# Patient Record
Sex: Female | Born: 1935 | Race: White | Hispanic: No | Marital: Married | Smoking: Former smoker
Health system: Southern US, Community
[De-identification: ages and names within clinical notes are randomized; demographics above are authoritative.]

## PROBLEM LIST (undated history)

## (undated) DIAGNOSIS — I2510 Atherosclerotic heart disease of native coronary artery without angina pectoris: Secondary | ICD-10-CM

## (undated) DIAGNOSIS — I7101 Dissection of thoracic aorta: Secondary | ICD-10-CM

## (undated) DIAGNOSIS — E785 Hyperlipidemia, unspecified: Secondary | ICD-10-CM

## (undated) DIAGNOSIS — E039 Hypothyroidism, unspecified: Secondary | ICD-10-CM

## (undated) DIAGNOSIS — R0609 Other forms of dyspnea: Secondary | ICD-10-CM

## (undated) DIAGNOSIS — I10 Essential (primary) hypertension: Secondary | ICD-10-CM

## (undated) DIAGNOSIS — N289 Disorder of kidney and ureter, unspecified: Secondary | ICD-10-CM

## (undated) DIAGNOSIS — I314 Cardiac tamponade: Secondary | ICD-10-CM

## (undated) DIAGNOSIS — I471 Supraventricular tachycardia, unspecified: Secondary | ICD-10-CM

## (undated) DIAGNOSIS — R0600 Dyspnea, unspecified: Secondary | ICD-10-CM

## (undated) DIAGNOSIS — C3490 Malignant neoplasm of unspecified part of unspecified bronchus or lung: Secondary | ICD-10-CM

## (undated) DIAGNOSIS — I714 Abdominal aortic aneurysm, without rupture, unspecified: Secondary | ICD-10-CM

## (undated) DIAGNOSIS — K2270 Barrett's esophagus without dysplasia: Secondary | ICD-10-CM

## (undated) DIAGNOSIS — C3411 Malignant neoplasm of upper lobe, right bronchus or lung: Secondary | ICD-10-CM

## (undated) DIAGNOSIS — K219 Gastro-esophageal reflux disease without esophagitis: Secondary | ICD-10-CM

## (undated) DIAGNOSIS — Z87442 Personal history of urinary calculi: Secondary | ICD-10-CM

## (undated) HISTORY — DX: Dyspnea, unspecified: R06.00

## (undated) HISTORY — DX: Barrett's esophagus without dysplasia: K22.70

## (undated) HISTORY — DX: Hyperlipidemia, unspecified: E78.5

## (undated) HISTORY — DX: Dissection of thoracic aorta: I71.01

## (undated) HISTORY — DX: Supraventricular tachycardia: I47.1

## (undated) HISTORY — DX: Supraventricular tachycardia, unspecified: I47.10

## (undated) HISTORY — PX: OTHER SURGICAL HISTORY: SHX169

## (undated) HISTORY — DX: Essential (primary) hypertension: I10

## (undated) HISTORY — DX: Other forms of dyspnea: R06.09

## (undated) HISTORY — DX: Atherosclerotic heart disease of native coronary artery without angina pectoris: I25.10

## (undated) HISTORY — DX: Abdominal aortic aneurysm, without rupture, unspecified: I71.40

## (undated) HISTORY — DX: Gastro-esophageal reflux disease without esophagitis: K21.9

## (undated) HISTORY — DX: Cardiac tamponade: I31.4

## (undated) HISTORY — DX: Malignant neoplasm of upper lobe, right bronchus or lung: C34.11

## (undated) HISTORY — DX: Abdominal aortic aneurysm, without rupture: I71.4

---

## 1993-06-29 DIAGNOSIS — I7101 Dissection of thoracic aorta: Secondary | ICD-10-CM

## 1993-06-29 DIAGNOSIS — I71012 Dissection of descending thoracic aorta: Secondary | ICD-10-CM

## 1993-06-29 HISTORY — DX: Dissection of descending thoracic aorta: I71.012

## 1993-06-29 HISTORY — DX: Dissection of thoracic aorta: I71.01

## 1998-06-29 DIAGNOSIS — I2510 Atherosclerotic heart disease of native coronary artery without angina pectoris: Secondary | ICD-10-CM

## 1998-06-29 HISTORY — DX: Atherosclerotic heart disease of native coronary artery without angina pectoris: I25.10

## 2000-11-23 ENCOUNTER — Ambulatory Visit (HOSPITAL_COMMUNITY): Admission: RE | Admit: 2000-11-23 | Discharge: 2000-11-24 | Payer: Self-pay | Admitting: Cardiology

## 2000-11-23 ENCOUNTER — Encounter: Payer: Self-pay | Admitting: Cardiology

## 2000-12-09 ENCOUNTER — Encounter: Payer: Self-pay | Admitting: Internal Medicine

## 2000-12-09 ENCOUNTER — Ambulatory Visit (HOSPITAL_COMMUNITY): Admission: RE | Admit: 2000-12-09 | Discharge: 2000-12-09 | Payer: Self-pay | Admitting: Internal Medicine

## 2001-01-10 ENCOUNTER — Ambulatory Visit (HOSPITAL_COMMUNITY): Admission: RE | Admit: 2001-01-10 | Discharge: 2001-01-10 | Payer: Self-pay | Admitting: Internal Medicine

## 2001-01-10 HISTORY — PX: COLONOSCOPY: SHX174

## 2001-08-17 ENCOUNTER — Ambulatory Visit (HOSPITAL_COMMUNITY): Admission: RE | Admit: 2001-08-17 | Discharge: 2001-08-17 | Payer: Self-pay | Admitting: Cardiology

## 2001-12-12 ENCOUNTER — Ambulatory Visit (HOSPITAL_COMMUNITY): Admission: RE | Admit: 2001-12-12 | Discharge: 2001-12-12 | Payer: Self-pay | Admitting: Internal Medicine

## 2001-12-12 ENCOUNTER — Encounter: Payer: Self-pay | Admitting: Internal Medicine

## 2003-02-07 ENCOUNTER — Ambulatory Visit (HOSPITAL_COMMUNITY): Admission: RE | Admit: 2003-02-07 | Discharge: 2003-02-07 | Payer: Self-pay | Admitting: Internal Medicine

## 2005-02-26 ENCOUNTER — Ambulatory Visit: Payer: Self-pay | Admitting: Cardiology

## 2005-03-18 ENCOUNTER — Ambulatory Visit (HOSPITAL_COMMUNITY): Admission: RE | Admit: 2005-03-18 | Discharge: 2005-03-18 | Payer: Self-pay | Admitting: Family Medicine

## 2005-04-22 ENCOUNTER — Ambulatory Visit: Payer: Self-pay | Admitting: Internal Medicine

## 2005-04-23 ENCOUNTER — Ambulatory Visit (HOSPITAL_COMMUNITY): Admission: RE | Admit: 2005-04-23 | Discharge: 2005-04-23 | Payer: Self-pay | Admitting: Internal Medicine

## 2005-04-29 ENCOUNTER — Ambulatory Visit: Payer: Self-pay | Admitting: Internal Medicine

## 2005-05-05 ENCOUNTER — Encounter: Admission: RE | Admit: 2005-05-05 | Discharge: 2005-05-05 | Payer: Self-pay | Admitting: Vascular Surgery

## 2005-05-07 ENCOUNTER — Ambulatory Visit: Payer: Self-pay

## 2005-05-08 ENCOUNTER — Ambulatory Visit: Payer: Self-pay | Admitting: Cardiology

## 2005-05-14 ENCOUNTER — Ambulatory Visit (HOSPITAL_COMMUNITY): Admission: RE | Admit: 2005-05-14 | Discharge: 2005-05-14 | Payer: Self-pay | Admitting: Vascular Surgery

## 2005-06-12 ENCOUNTER — Inpatient Hospital Stay (HOSPITAL_COMMUNITY): Admission: RE | Admit: 2005-06-12 | Discharge: 2005-06-20 | Payer: Self-pay | Admitting: Vascular Surgery

## 2005-06-12 ENCOUNTER — Encounter (INDEPENDENT_AMBULATORY_CARE_PROVIDER_SITE_OTHER): Payer: Self-pay | Admitting: *Deleted

## 2005-06-12 HISTORY — PX: ABDOMINAL AORTIC ANEURYSM REPAIR: SUR1152

## 2005-06-17 ENCOUNTER — Ambulatory Visit: Payer: Self-pay | Admitting: Cardiovascular Disease

## 2005-11-19 ENCOUNTER — Ambulatory Visit: Payer: Self-pay | Admitting: Internal Medicine

## 2005-12-03 ENCOUNTER — Ambulatory Visit: Payer: Self-pay | Admitting: Internal Medicine

## 2005-12-03 ENCOUNTER — Encounter (INDEPENDENT_AMBULATORY_CARE_PROVIDER_SITE_OTHER): Payer: Self-pay | Admitting: *Deleted

## 2005-12-03 ENCOUNTER — Ambulatory Visit (HOSPITAL_COMMUNITY): Admission: RE | Admit: 2005-12-03 | Discharge: 2005-12-03 | Payer: Self-pay | Admitting: Internal Medicine

## 2005-12-03 HISTORY — PX: OTHER SURGICAL HISTORY: SHX169

## 2006-02-26 ENCOUNTER — Ambulatory Visit: Payer: Self-pay | Admitting: Cardiology

## 2006-03-08 ENCOUNTER — Ambulatory Visit (HOSPITAL_COMMUNITY): Admission: RE | Admit: 2006-03-08 | Discharge: 2006-03-08 | Payer: Self-pay | Admitting: Family Medicine

## 2007-03-02 ENCOUNTER — Ambulatory Visit: Payer: Self-pay | Admitting: Cardiology

## 2007-03-23 ENCOUNTER — Ambulatory Visit: Payer: Self-pay

## 2007-03-26 ENCOUNTER — Emergency Department (HOSPITAL_COMMUNITY): Admission: EM | Admit: 2007-03-26 | Discharge: 2007-03-26 | Payer: Self-pay | Admitting: Emergency Medicine

## 2007-04-13 ENCOUNTER — Ambulatory Visit: Payer: Self-pay | Admitting: Internal Medicine

## 2007-05-09 ENCOUNTER — Ambulatory Visit: Payer: Self-pay | Admitting: Internal Medicine

## 2008-03-08 ENCOUNTER — Ambulatory Visit: Payer: Self-pay | Admitting: Cardiology

## 2008-12-06 ENCOUNTER — Encounter: Payer: Self-pay | Admitting: Cardiology

## 2009-01-25 ENCOUNTER — Encounter (INDEPENDENT_AMBULATORY_CARE_PROVIDER_SITE_OTHER): Payer: Self-pay | Admitting: *Deleted

## 2009-02-28 DIAGNOSIS — I10 Essential (primary) hypertension: Secondary | ICD-10-CM | POA: Insufficient documentation

## 2009-02-28 DIAGNOSIS — Z8679 Personal history of other diseases of the circulatory system: Secondary | ICD-10-CM | POA: Insufficient documentation

## 2009-02-28 DIAGNOSIS — I319 Disease of pericardium, unspecified: Secondary | ICD-10-CM | POA: Insufficient documentation

## 2009-02-28 DIAGNOSIS — E785 Hyperlipidemia, unspecified: Secondary | ICD-10-CM | POA: Insufficient documentation

## 2009-03-05 ENCOUNTER — Ambulatory Visit: Payer: Self-pay | Admitting: Cardiology

## 2009-03-05 DIAGNOSIS — I712 Thoracic aortic aneurysm, without rupture, unspecified: Secondary | ICD-10-CM | POA: Insufficient documentation

## 2009-03-05 DIAGNOSIS — R0609 Other forms of dyspnea: Secondary | ICD-10-CM

## 2009-03-05 DIAGNOSIS — R0989 Other specified symptoms and signs involving the circulatory and respiratory systems: Secondary | ICD-10-CM

## 2009-03-12 DIAGNOSIS — R197 Diarrhea, unspecified: Secondary | ICD-10-CM

## 2009-03-12 DIAGNOSIS — R109 Unspecified abdominal pain: Secondary | ICD-10-CM | POA: Insufficient documentation

## 2009-03-12 DIAGNOSIS — R1110 Vomiting, unspecified: Secondary | ICD-10-CM

## 2009-03-20 ENCOUNTER — Ambulatory Visit: Payer: Self-pay | Admitting: Internal Medicine

## 2009-03-20 DIAGNOSIS — K219 Gastro-esophageal reflux disease without esophagitis: Secondary | ICD-10-CM | POA: Insufficient documentation

## 2009-03-20 DIAGNOSIS — K2270 Barrett's esophagus without dysplasia: Secondary | ICD-10-CM

## 2009-03-20 DIAGNOSIS — R198 Other specified symptoms and signs involving the digestive system and abdomen: Secondary | ICD-10-CM

## 2009-03-21 ENCOUNTER — Telehealth (INDEPENDENT_AMBULATORY_CARE_PROVIDER_SITE_OTHER): Payer: Self-pay | Admitting: *Deleted

## 2009-03-21 ENCOUNTER — Encounter: Payer: Self-pay | Admitting: Internal Medicine

## 2009-03-25 ENCOUNTER — Encounter: Payer: Self-pay | Admitting: Cardiology

## 2009-03-25 ENCOUNTER — Ambulatory Visit: Payer: Self-pay

## 2009-03-25 ENCOUNTER — Ambulatory Visit: Payer: Self-pay | Admitting: Cardiology

## 2009-04-10 ENCOUNTER — Ambulatory Visit: Payer: Self-pay | Admitting: Internal Medicine

## 2009-04-10 ENCOUNTER — Encounter: Payer: Self-pay | Admitting: Internal Medicine

## 2009-04-10 ENCOUNTER — Ambulatory Visit (HOSPITAL_COMMUNITY): Admission: RE | Admit: 2009-04-10 | Discharge: 2009-04-10 | Payer: Self-pay | Admitting: Internal Medicine

## 2009-04-10 HISTORY — PX: ESOPHAGOGASTRODUODENOSCOPY: SHX1529

## 2009-04-14 ENCOUNTER — Encounter: Payer: Self-pay | Admitting: Internal Medicine

## 2009-04-16 ENCOUNTER — Ambulatory Visit (HOSPITAL_COMMUNITY): Admission: RE | Admit: 2009-04-16 | Discharge: 2009-04-16 | Payer: Self-pay | Admitting: Internal Medicine

## 2009-04-30 ENCOUNTER — Telehealth (INDEPENDENT_AMBULATORY_CARE_PROVIDER_SITE_OTHER): Payer: Self-pay

## 2010-03-04 DIAGNOSIS — I2510 Atherosclerotic heart disease of native coronary artery without angina pectoris: Secondary | ICD-10-CM | POA: Insufficient documentation

## 2010-03-07 ENCOUNTER — Ambulatory Visit: Payer: Self-pay | Admitting: Cardiology

## 2010-03-07 DIAGNOSIS — R5383 Other fatigue: Secondary | ICD-10-CM

## 2010-03-07 DIAGNOSIS — R5381 Other malaise: Secondary | ICD-10-CM

## 2010-04-01 ENCOUNTER — Ambulatory Visit: Payer: Self-pay | Admitting: Cardiology

## 2010-04-02 LAB — CONVERTED CEMR LAB: TSH: 0.97 microintl units/mL (ref 0.35–5.50)

## 2010-06-03 ENCOUNTER — Ambulatory Visit: Payer: Self-pay | Admitting: Internal Medicine

## 2010-06-03 DIAGNOSIS — R1011 Right upper quadrant pain: Secondary | ICD-10-CM

## 2010-06-09 ENCOUNTER — Ambulatory Visit (HOSPITAL_COMMUNITY)
Admission: RE | Admit: 2010-06-09 | Discharge: 2010-06-09 | Payer: Self-pay | Source: Home / Self Care | Attending: Internal Medicine | Admitting: Internal Medicine

## 2010-06-10 ENCOUNTER — Encounter: Payer: Self-pay | Admitting: Internal Medicine

## 2010-06-11 ENCOUNTER — Encounter (HOSPITAL_COMMUNITY)
Admission: RE | Admit: 2010-06-11 | Discharge: 2010-07-11 | Payer: Self-pay | Source: Home / Self Care | Attending: Internal Medicine | Admitting: Internal Medicine

## 2010-06-17 ENCOUNTER — Encounter: Payer: Self-pay | Admitting: Internal Medicine

## 2010-07-03 LAB — HEPATIC FUNCTION PANEL
ALT: 22 U/L (ref 0–35)
AST: 27 U/L (ref 0–37)
Albumin: 3.9 g/dL (ref 3.5–5.2)
Alkaline Phosphatase: 66 U/L (ref 39–117)
Bilirubin, Direct: 0.1 mg/dL (ref 0.0–0.3)
Indirect Bilirubin: 0.5 mg/dL (ref 0.3–0.9)
Total Bilirubin: 0.6 mg/dL (ref 0.3–1.2)
Total Protein: 6.4 g/dL (ref 6.0–8.3)

## 2010-07-03 LAB — CBC
HCT: 39.3 % (ref 36.0–46.0)
Hemoglobin: 13.7 g/dL (ref 12.0–15.0)
MCH: 33.9 pg (ref 26.0–34.0)
MCHC: 34.9 g/dL (ref 30.0–36.0)
MCV: 97.3 fL (ref 78.0–100.0)
Platelets: 152 10*3/uL (ref 150–400)
RBC: 4.04 MIL/uL (ref 3.87–5.11)
RDW: 13 % (ref 11.5–15.5)
WBC: 7.1 10*3/uL (ref 4.0–10.5)

## 2010-07-03 LAB — BASIC METABOLIC PANEL
BUN: 20 mg/dL (ref 6–23)
CO2: 26 mEq/L (ref 19–32)
Calcium: 9.2 mg/dL (ref 8.4–10.5)
Chloride: 104 mEq/L (ref 96–112)
Creatinine, Ser: 1.16 mg/dL (ref 0.4–1.2)
GFR calc Af Amer: 55 mL/min — ABNORMAL LOW (ref >60)
GFR calc non Af Amer: 45 mL/min — ABNORMAL LOW (ref >60)
Glucose, Bld: 67 mg/dL — ABNORMAL LOW (ref 70–99)
Potassium: 4.1 mEq/L (ref 3.5–5.1)
Sodium: 139 mEq/L (ref 135–145)

## 2010-07-04 ENCOUNTER — Encounter: Payer: Self-pay | Admitting: Cardiology

## 2010-07-07 ENCOUNTER — Encounter (INDEPENDENT_AMBULATORY_CARE_PROVIDER_SITE_OTHER): Payer: Self-pay | Admitting: *Deleted

## 2010-07-09 ENCOUNTER — Ambulatory Visit (HOSPITAL_COMMUNITY)
Admission: RE | Admit: 2010-07-09 | Discharge: 2010-07-09 | Payer: Self-pay | Source: Home / Self Care | Attending: General Surgery | Admitting: General Surgery

## 2010-07-09 HISTORY — PX: CHOLECYSTECTOMY: SHX55

## 2010-07-24 LAB — SURGICAL PCR SCREEN
MRSA, PCR: NEGATIVE
Staphylococcus aureus: NEGATIVE

## 2010-07-27 LAB — CONVERTED CEMR LAB
ALT: 15 units/L (ref 0–35)
ALT: 20 units/L (ref 0–35)
AST: 23 units/L (ref 0–37)
AST: 26 units/L (ref 0–37)
Albumin: 4 g/dL (ref 3.5–5.2)
Albumin: 4.5 g/dL (ref 3.5–5.2)
Alkaline Phosphatase: 55 units/L (ref 39–117)
Alkaline Phosphatase: 62 units/L (ref 39–117)
BUN: 18 mg/dL (ref 6–23)
BUN: 20 mg/dL (ref 6–23)
Bilirubin, Direct: 0.1 mg/dL (ref 0.0–0.3)
Bilirubin, Direct: 0.1 mg/dL (ref 0.0–0.3)
CO2: 26 meq/L (ref 19–32)
CO2: 29 meq/L (ref 19–32)
Calcium: 9.4 mg/dL (ref 8.4–10.5)
Calcium: 9.7 mg/dL (ref 8.4–10.5)
Chloride: 101 meq/L (ref 96–112)
Chloride: 109 meq/L (ref 96–112)
Cholesterol: 127 mg/dL (ref 0–200)
Creatinine, Ser: 1.02 mg/dL (ref 0.40–1.20)
Creatinine, Ser: 1.2 mg/dL (ref 0.4–1.2)
GFR calc non Af Amer: 46.21 mL/min (ref >60)
Glucose, Bld: 81 mg/dL (ref 70–99)
Glucose, Bld: 92 mg/dL (ref 70–99)
HDL: 31.9 mg/dL — ABNORMAL LOW (ref >39.00)
Indirect Bilirubin: 0.4 mg/dL (ref 0.0–0.9)
LDL Cholesterol: 67 mg/dL (ref 0–99)
Potassium: 4.2 meq/L (ref 3.5–5.1)
Potassium: 5 meq/L (ref 3.5–5.3)
Sodium: 139 meq/L (ref 135–145)
Sodium: 142 meq/L (ref 135–145)
TSH: 5.877 microintl units/mL — ABNORMAL HIGH (ref 0.350–4.500)
Total Bilirubin: 0.5 mg/dL (ref 0.3–1.2)
Total Bilirubin: 1.1 mg/dL (ref 0.3–1.2)
Total CHOL/HDL Ratio: 4
Total Protein: 6.8 g/dL (ref 6.0–8.3)
Total Protein: 7.1 g/dL (ref 6.0–8.3)
Triglycerides: 139 mg/dL (ref 0.0–149.0)
VLDL: 27.8 mg/dL (ref 0.0–40.0)

## 2010-07-31 NOTE — Letter (Signed)
Summary: surgical clearence request  surgical clearence request   Imported By: Faythe Ghee 07/04/2010 10:04:37  _____________________________________________________________________  External Attachment:    Type:   Image     Comment:   External Document  Appended Document: surgical clearence request she is cleared at low operative risk.

## 2010-07-31 NOTE — Miscellaneous (Signed)
Summary: Orders Update  Clinical Lists Changes  Orders: Added new Test order of T-Basic Metabolic Panel [phone]) - Signed Added new Test order of T-Hepatic Function [phone]) - Signed

## 2010-07-31 NOTE — Letter (Signed)
Summary: ABD U/S ORDER  ABD U/S ORDER   Imported By: Ave Filter 06/03/2010 12:18:28  _____________________________________________________________________  External Attachment:    Type:   Image     Comment:   External Document

## 2010-07-31 NOTE — Assessment & Plan Note (Signed)
Summary: yearly/sl      Allergies Added: NKDA  Visit Type:  1 yr f/u Primary Provider:  McGough  CC:  no cardiac complaints today.  History of Present Illness: [name] comes in today for followup of her coronary disease, history of a dissected ascending aorta complicated pericardial tampanade, hypertension, hyperlipidemia, and mild aortic insufficiency.  Her last visit, we performed an echocardiogram showed normal left ventricular function EF 55-60%. She had mild aortic insufficiency. She also had a stress Myoview which showed an EF of 79% with no ischemia. Abdominal aortic ultrasound showed a normal aorta with no evidence of aneurysm.  She's been having no chest pain or anginal symptoms. Her biggest complaint is fatigue. There she denies any increase in her dyspnea on exertion, PND, orthopnea, peripheral edema or palpitations.  Clinical Reports Reviewed:  Nuclear Study:  03/25/2009:  Excerise capacity: Adenosine study with no exercise  Blood Pressure response: Normal blood pressure response  Clinical symptoms: Chest heavy  ECG impression: No significant ST segment change suggestive of ischemia  Overall impression: Normal stress nuclear study.  [name] Rough, MD, [facility]   03/23/2007:  Excerise capacity:  Adenosine study with no exercise  Blood Pressure response: Normal blood pressure response  Clinical symptoms: Chest pressure  ECG impression: No significant ST segment change suggestive of ischemia  Overall impression: Normal stress nuclear study. The LV cavity is small.  [name] Rough, MD   05/07/2005:  Excerise capacity: This was an adenosine study with no exercise  Blood Pressure response: Normal blood pressure response  Clinical symptoms: No chets pain or dyspnea  ECG impression: No diagnosotic ST ot T wave changes to suggest ischemia  Overall impression: Low risk stress nuclear study.  Vida Roller, MD   Current Medications (verified): 1)   Quinapril Hcl 20 Mg Tabs (Quinapril Hcl) .Marland Kitchen.. 1 Tab Once Daily 2)  Aspirin 81 Mg Tbec (Aspirin) .... Take One Tablet By Mouth Daily 3)  Zetia 10 Mg Tabs (Ezetimibe) .Marland Kitchen.. 1 Tab Once Daily 4)  Lipitor 10 Mg Tabs (Atorvastatin Calcium) .Marland Kitchen.. 1 Tab Once Daily 5)  Metoprolol Tartrate 100 Mg Tabs (Metoprolol Tartrate) .Marland Kitchen.. 1 Tab Two Times A Day 6)  Omeprazole 20 Mg Tbec (Omeprazole) .Marland Kitchen.. 1 Tab Once Daily 7)  Tylenol 325 Mg Tabs (Acetaminophen) .... As Needed 8)  Claritin 10 Mg Tabs (Loratadine) .... As Needed  Allergies (verified): No Known Drug Allergies  Past History:  Past Medical History: Last updated: 03/04/2010 CARDIAC TAMPONADE (ICD-423.9) SUPRAVENTRICULAR TACHYCARDIA, HX OF (ICD-V12.59) CAD, NATIVE VESSEL (ICD-414.01) HYPERTENSION, UNSPECIFIED (ICD-401.9) HYPERLIPIDEMIA-MIXED (ICD-272.4) ASCENDING AORTIC ANEURYSM (ICD-441.2) ABDOMINAL AORTIC ANEURYSM (ICD-441.4) DYSPNEA ON EXERTION (ICD-786.09) GERD (ICD-530.81) EARLY SATIETY (ICD-789.9) BARRETTS ESOPHAGUS (ICD-530.85) Hx of LOOSE STOOLS (ICD-787.91) Hx of ABDOMINAL PAIN (ICD-789.00) Hx of VOMITING (ICD-787.03) Gastroesophageal reflux disease with Barrett's esophagus, last endoscopy in June 2007 showed short segment Barrett's in the distal tubular esophagus, biopsies showed no dysplasia, she had a small hiatal hernia, area of the erosion with possible focal ulceration with overlying eschar and exudate with benign biopsies. H. pylori serology is positive and she was treated with Prevpac. Last colonoscopy in 2002 was normal. She is due for screening colonoscopy in [address].   Past Surgical History: Last updated: 03/20/2009 EGD with biopsy.Jonathon Bellows, M.D...12/03/2005 Resection and grafting of infrarenal abdominal aortic aneurysm with insertion of an aortobiexternal iliac graft using a 14 x 7 mm Hemashield graft. SURGEON:  Quita Skye. Hart Rochester, M.D.06/12/2005 Abdominal aortogram (biplane abdominal aortogram with bilateral lower  extremity runoff the right  common femoral approach). SURGEON:  Dr. Hart Rochester..05/14/2005 Esophagogastroduodenoscopy with biopsy.Jonathon Bellows, M.D. Marland Kitchen. 02/07/2003 History of dissecting aneurysm in the chest in 1995 requiring surgery. She required pericardial  window for cardiac tamponade. 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 Married, 4 sons Tobacco Use - Former. quit 1995.Marland Kitchen1ppd x 53yrs ago  Risk Factors: Smoking Status: quit (02/28/2009)  Review of Systems       negative other than history of present illness  Vital Signs:  Patient profile:   77 year old female Height:      63 inches Weight:      123.8 pounds BMI:     22.01 Pulse rate:   66 / minute Pulse rhythm:   irregular BP sitting:   124 / 70  (left arm) Cuff size:   large  Vitals Entered By: Danielle Rankin, CMA (March 07, 2010 4:30 PM)  Physical Exam  General:  no acute distress, elderly Head:  normocephalic and atraumatic Eyes:  PERRLA/EOM intact; conjunctiva and lids normal. Neck:  Neck supple, no JVD. No masses, thyromegaly or abnormal cervical nodes. Lungs:  Clear bilaterally to auscultation and percussion. Heart:  PMI nondisplaced, regular rate and rhythm, normal S1-S2, no aortic insufficiency heard. Carotid Dopplers equal without bruits Msk:  decreased ROM.   Pulses:  pulses normal in all 4 extremities Extremities:  No clubbing or cyanosis. Neurologic:  Alert and oriented x 3. Skin:  pale and dry Psych:  Normal affect.   Problems:  Medical Problems Added: 1)  Dx of Fatigue / Malaise  (ICD-780.79)  EKG  Procedure date:  03/07/2010  Findings:      normal sinus rhythm, normal  EKG  Impression & Recommendations:  Problem # 1:  CAD, NATIVE VESSEL (ICD-414.01)  Her updated medication list for this problem includes:    Quinapril Hcl 20 Mg Tabs (Quinapril hcl) .Marland Kitchen... 1 tab once daily    Aspirin 81 Mg Tbec (Aspirin) .Marland Kitchen... Take one tablet by mouth daily    Metoprolol Tartrate 100 Mg Tabs (Metoprolol tartrate) .Marland Kitchen... 1 tab two times a day  Orders: EKG w/ Interpretation (93000) T-TSH (09811-91478)  Problem # 2:  HYPERTENSION, UNSPECIFIED (ICD-401.9)  Her updated medication list for this problem includes:    Quinapril Hcl 20 Mg Tabs (Quinapril hcl) .Marland Kitchen... 1 tab once daily    Aspirin 81 Mg Tbec (Aspirin) .Marland Kitchen... Take one tablet by mouth daily    Metoprolol Tartrate 100 Mg Tabs (Metoprolol tartrate) .Marland Kitchen... 1 tab two times a day  Problem # 3:  ASCENDING AORTIC ANEURYSM (ICD-441.2) Assessment: Unchanged  Problem # 4:  HYPERLIPIDEMIA-MIXED (ICD-272.4)  Her updated medication list for this problem includes:    Zetia 10 Mg Tabs (Ezetimibe) .Marland Kitchen... 1 tab once daily    Lipitor 10 Mg Tabs (Atorvastatin calcium) .Marland Kitchen... 1 tab once daily  Problem # 5:  FATIGUE / MALAISE (ICD-780.79) Assessment: New I am going to check a TSH and CBC. I suspect this is multifactorial.  Problem # 6:  DYSPNEA ON EXERTION (ICD-786.09) Assessment: Unchanged  Her updated medication list for this problem includes:    Quinapril Hcl 20 Mg Tabs (Quinapril hcl) .Marland Kitchen... 1 tab once daily    Aspirin 81 Mg Tbec (Aspirin) .Marland Kitchen... Take one tablet by  mouth daily    Metoprolol Tartrate 100 Mg Tabs (Metoprolol tartrate) .Marland Kitchen... 1 tab two times a day  Patient Instructions: 1)  Your physician recommends that you schedule a follow-up appointment in: 1 year with Dr. Daleen Squibb 2)  Your physician recommends that you continue on your current medications as directed. Please refer to the Current Medication list given to you today.

## 2010-07-31 NOTE — Assessment & Plan Note (Signed)
Summary: FU OV OMEPRAZOLE REFILLS/SS   Visit Type:  Follow-up Visit Primary Care [name]:  [name]  Chief Complaint:  F/U for Omeprazole refills.  History of Present Illness: [name] is here for f/u of GERD/Barrett's. She also has h/o CBD dilation and ?stone in 2010 on abd u/s. She never followed through on MRCP.   She has some intermittent ruq pain into back predominantly with movement. She recalls having chest tube placed years ago with her aneurysm repair.  Never had MRCP last year when abd u/s showed possible cbd stone. Rare pp abd pain. GERD doing okay, rare heartburn. Sometimes nocturnal reflux with late evening meals. Takes omeprazole everyday. No dysphagia. No weight loss. BM regular with fiber diet. No melena, brbpr. Some daily gas/nausea.   Current Medications (verified): 1)  Quinapril Hcl 20 Mg Tabs (Quinapril Hcl) .Marland Kitchen.. 1 Tab Once Daily 2)  Aspirin 81 Mg Tbec (Aspirin) .... Take One Tablet By Mouth Daily 3)  Zetia 10 Mg Tabs (Ezetimibe) .Marland Kitchen.. 1 Tab Once Daily 4)  Lipitor 10 Mg Tabs (Atorvastatin Calcium) .Marland Kitchen.. 1 Tab Once Daily 5)  Metoprolol Tartrate 100 Mg Tabs (Metoprolol Tartrate) .Marland Kitchen.. 1 Tab Two Times A Day 6)  Omeprazole 20 Mg Tbec (Omeprazole) .Marland Kitchen.. 1 Tab Once Daily 7)  Tylenol 325 Mg Tabs (Acetaminophen) .... As Needed 8)  Levothyroxine Sodium 50 Mcg Tabs (Levothyroxine Sodium) .... Take 1 Tablet Daily  Allergies (verified): No Known Drug Allergies  Review of Systems      See HPI  Vital Signs:  Patient profile:   77 year old female Height:      63 inches Weight:      128 pounds BMI:     22.76 Temp:     98.4 degrees F oral Pulse rate:   68 / minute BP sitting:   132 / 68  (left arm) Cuff size:   regular  Vitals Entered By: Cloria Spring LPN (June 03, 2010 9:40 AM)  Physical Exam  General:  Well developed, well nourished, no acute distress. Head:  Normocephalic and atraumatic. Eyes:  sclera nonicteric Mouth:  op moist Lungs:  Clear throughout to  auscultation. Heart:  Regular rate and rhythm; no murmurs, rubs,  or bruits. Abdomen:  Bowel sounds normal.  Abdomen is soft, nontender, nondistended.  No rebound or guarding.  No hepatosplenomegaly, masses or hernias.  No abdominal bruits.  Extremities:  No clubbing, cyanosis, edema or deformities noted. Neurologic:  Alert and  oriented x4;  grossly normal neurologically. Skin:  Intact without significant lesions or rashes. Psych:  Alert and cooperative. Normal mood and affect.  Impression & Recommendations:  Problem # 1:  GERD (ICD-530.81)  Chronic GERD and Barrett's. Doing well. Next EGD due 03/2012. Continue omeprazole. OV 10/12.  Orders: Est. Patient Level II (16109)  Problem # 2:  RUQ PAIN (ICD-789.01)  Intermittent RUQ pain, not really pp. Mostly with movement. She does have h/o abnormal u/s last year with ?CBD stone. Patient failed to have MRCP done. Recent LFTs normal. Doubt she has retained CBD stone but check abd u/s again and if suspicious then will pursue MRCP.   Orders: Est. Patient Level II (60454)

## 2010-07-31 NOTE — Letter (Signed)
Summary: HIDA SCAN ORDER  HIDA SCAN ORDER   Imported By: Ave Filter 06/10/2010 10:11:48  _____________________________________________________________________  External Attachment:    Type:   Image     Comment:   External Document

## 2010-07-31 NOTE — Letter (Signed)
Summary: DR Lovell Sheehan REFERRAL  DR Lovell Sheehan REFERRAL   Imported By: Ave Filter 06/17/2010 16:03:26  _____________________________________________________________________  External Attachment:    Type:   Image     Comment:   External Document

## 2010-07-31 NOTE — Miscellaneous (Signed)
Summary: HISTORY & PHYSICAL  Clinical Lists Changes  [name], [name]                ACCOUNT NO.:  [id number]      MEDICAL RECORD NO.:  [id number]         PATIENT TYPE:  PAMB      LOCATION:  DAY                           FACILITY:  [facility]      PHYSICIAN:  Dalia Heading, M.D.  DATE OF BIRTH:  Sep 08, 1928      DATE OF ADMISSION:   DATE OF DISCHARGE:  LH                                 HISTORY          CHIEF COMPLAINT:  Chronic cholecystitis.      HISTORY OF PRESENT ILLNESS:  The patient is a 77 year old white female   who is referred for evaluation and treatment of biliary colic secondary   to chronic cholecystitis.  She has been having intermittent right upper   quadrant abdominal pain with radiation to the flank, nausea, and   indigestion for the past few years.  It seems to be getting worse.  It   is made worse with fatty foods.  No fever, chills, or jaundice have been   noted.      PAST MEDICAL HISTORY:  Chronic back issues, hypothyroidism,   hypertension, high cholesterol levels, extrinsic allergies.      PAST SURGICAL HISTORY:  Dissecting aortic repair in the past, back   surgery, pericardial window.      CURRENT MEDICATIONS:  Baby aspirin which she is holding, Tylenol as   needed for pain, a cholesterol pill, quinapril 20 mg p.o. daily,   metoprolol 100 mg p.o. b.i.d., levothyroxine 50 mcg p.o. daily, Zetia 10   mg p.o. daily.      ALLERGIES:  No known drug allergies.      REVIEW OF SYSTEMS:  The patient denies drinking or smoking.  She denies   any other cardiopulmonary difficulties or bleeding disorders.      FAMILY MEDICAL HISTORY:  Noncontributory.      PHYSICAL EXAMINATION:  GENERAL:  The patient is a well-developed, well-   nourished white female in no acute distress.   HEENT:  No scleral icterus.   LUNGS:  Clear to auscultation with equal breath sounds bilaterally.   HEART:  Regular rate and rhythm without S3, S4, or murmurs.   ABDOMEN:  Soft and  nondistended.  She is tender in the right upper   quadrant to palpation.  No hepatosplenomegaly, masses, or hernias are   identified.      Ultrasound of the gallbladder reveals sludge within a normal common bile   duct.  HIDA scan reveals a low gallbladder ejection fraction with   reproducible symptoms.      IMPRESSION:  Chronic cholecystitis.      PLAN:  The patient is scheduled for laparoscopic cholecystectomy on   July 09, 2010.  The risks and benefits of the procedure including   bleeding, infection, hepatobiliary injury, and the possibility of an   open procedure were fully explained to the patient, gave informed   consent.               Mark A.  Lovell Sheehan, M.D.               MAJ/MEDQ  D:  07/01/2010  T:  07/02/2010  Job:  [id number]      cc:   R. Roetta Sessions, M.D.   [address]   [place]   [place] [address]      Kirk Ruths, M.D.   Fax: [phone]      Electronically Signed by Franky Macho M.D. on 07/03/2010 11:18:06 AM

## 2010-10-08 ENCOUNTER — Other Ambulatory Visit: Payer: Self-pay | Admitting: Cardiology

## 2010-10-08 NOTE — Telephone Encounter (Signed)
Please send to primary MD

## 2010-10-10 ENCOUNTER — Other Ambulatory Visit: Payer: Self-pay | Admitting: *Deleted

## 2010-10-10 MED ORDER — LEVOTHYROXINE SODIUM 50 MCG PO TABS: 50.0000 ug | ORAL_TABLET | Freq: Every day | ORAL | Status: DC

## 2010-10-10 NOTE — Telephone Encounter (Signed)
Pt needs levothyroxine to be called to [facility]. Pt states dr wall was the one who gave it to her.

## 2010-10-14 ENCOUNTER — Other Ambulatory Visit: Payer: Self-pay | Admitting: Cardiology

## 2010-11-11 NOTE — Assessment & Plan Note (Signed)
NAMELOVELL, ROE                 CHART#:  [id number]   DATE:                                   DOB:  02-16-1929   CHIEF COMPLAINT:  Recent diverticulitis.   SUBJECTIVE:  The patient is here for a follow up.  She was asked to  follow up with our office after a recent emergency department visit.  She was seen in the emergency department on 03/26/2007.  She presented  with vomiting, abdominal pain, and loose stool.  Her white count was  13,800.  LFT's were normal.  Sed rate was 3.  She had a CT of the  abdomen and pelvis which revealed mild sigmoid diverticulitis without  abscess.  She was started on Cipro and Flagyl.  She has completed her  antibiotics.  She states she is feeling better.  She has had no further  abdominal pain.  She states her stools are formed.  She is having one  formed stool daily.  Her reflux is well controlled.  She has had no  further vomiting.  Her appetite is improving.  She feels well.  She has  some cough and congestion related to a viral upper respiratory  infection.  She has had this for about 4 days.   CURRENT MEDICATIONS:  See updated list.   ALLERGIES:  No known drug allergies.   PHYSICAL EXAMINATION:  VITAL SIGNS:  Weight:  128.5.  Temperature:  97.7.  Blood pressure:  100/70.  Pulse:  72.  GENERAL:  Pleasant, well-nourished, well-developed, elderly Caucasian  female in no acute distress.  SKIN:  Warm and dry without jaundice.  CHEST:  Lungs are clear to auscultation.  CARDIAC:  Reveals regular rate and rhythm.  ABDOMEN:  Positive bowel sounds, soft, nontender and nondistended.  No  organomegaly or masses.  No rebound tenderness or guarding.  LOWER EXTREMITIES:  No edema.   IMPRESSION:  1. Recent acute diverticulitis, resolved.  2. Chronic gastroesophageal reflux disease with history of Barrett's      esophagus.   PLAN:  1. We will discuss further with Dr. Jena Gauss with regards to timing of      her next surveillance EGD.  2. Patient's last  colonoscopy was in 2002.  We will address whether or      not she needs to have a colonoscopy based on recent CT findings      although appears to be uncomplicated      diverticulitis.  3. Further recommendations to follow.       Tana Coast, P.A.  Electronically Signed     R. Roetta Sessions, M.D.  Electronically Signed    LL/MEDQ  D:  04/13/2007  T:  04/14/2007  Job:  [id number]

## 2010-11-11 NOTE — Assessment & Plan Note (Signed)
[facility]                            CARDIOLOGY OFFICE NOTE   NAME:[name], [name] BOWERSOX                       MRN:          [id number]  DATE:03/08/2008                            DOB:          1929/01/30    [name] returns today for followup.   PROBLEMS:  1. Coronary artery disease.  She has had a previous stent at the right      coronary artery several years ago.  Her last stress Myoview was      March 23, 2007, which showed an EF of 84% with no ischemia.      She has had a small LV cavity.  2. 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  This result in tamponade with an emergency      pericardiocentesis with exploratory surgery repair by Dr. Andrey Campanile 14      years ago.  3. Hyperlipidemia.  4. Hypertension.  5. History of abdominal aortic aneurysm repair by Dr. Hart Rochester in      December 2006.  Her last abdominal ultrasound showed aorto by      external iliac artery Hemashield bypass graft to be patent with      normal caliber and flow.  She had a widely patent proximal left      renal artery by color Doppler.  She has an occluded right renal      artery which was seen at the surgery.  6. History of supraventricular tachycardia.   She has had a good year overall.  She is having no symptoms of angina or  ischemia.  She has had no chest pain, no abdominal pain.   Her current meds were Zetia 10 mg a day, Lipitor 10 mg a day, quinapril  20 mg a day, aspirin 81 mg a day, metoprolol 100 mg p.o. b.i.d.,  omeprazole 20 mg a day.   PHYSICAL EXAMINATION:  Her blood pressure is 114/68, her pulse is 63,  and she is in sinus rhythm with a normal EKG overall.  Her weight is  132, which is stable.  HEENT is normal.  Skin color is pale and dry.  Carotid upstrokes were equal bilateral without bruits, no JVD.  Thyroid  is not enlarged.  HEART reveals soft S1 and S2.  There is no diastolic  murmur.  Lungs are clear to auscultation  and percussion.  Abdominal exam  is soft with no pulsatile mass.  No bruit.  There is no obvious  organomegaly.  Extremities reveal pulses to be intact distally 1+ or 4+,  capillary reflexes are reduced, her feet are slightly cool.  No sign of  tissue loss or ulceration, no sign of DVT.  Neuro exam is intact.  Skin  has got to have few ecchymoses otherwise normal.   [name] is stable from our standpoint.  She is not to do any  objective studies.  We have made no changes in medical program.  I will  see her back again in a year.    Thomas C. Daleen Squibb, MD, [facility]  Electronically Signed  TCW/MedQ  DD: 03/08/2008  DT: 03/09/2008  Job #: [id number]   cc:   Kirk Ruths, M.D.

## 2010-11-11 NOTE — Assessment & Plan Note (Signed)
[facility]                            CARDIOLOGY OFFICE NOTE   NAME:[name], [name] BASURTO                       MRN:          [id number]  DATE:03/02/2007                            DOB:          1928-10-09    [name] returns today for management of the following issues:  1. Coronary artery disease. She is having no ischemic symptoms. She      had a stress Myoview on May 07, 2005, that showed an EF of 83%      with no ischemia.  2. Peripheral vascular disease. She is status post spontaneous aortic      dissection with pericardial tamponade, status post emergency      pericardiocentesis, exploratory surgery and repair by Dr.  Andrey Campanile      13 years ago.  3. Hyperlipidemia. This is being followed by Dr.  Regino Schultze.  4. Hypertension.  5. Status post abdominal aortic aneurysm repair by Dr.  Hart Rochester      December 2006.  6. History of supraventricular tachycardia.   She has had a good year. Her weight is stable. She is having no symptoms  of angina or ischemia.   MEDICATIONS:  1. Zetia 10 mg a day.  2. Aciphex 20 mg a day.  3. Lipitor 10 mg a day.  4. Toprol 100 mg a day.  5. Quinapril 20 mg a day.  6. Aspirin 81 mg a day.   Her blood pressure today is 115/70, pulse 67 and regular. EKG is normal.  Weight is stable at 133 pounds.  HEENT: Normocephalic, atraumatic. PERRLA. Extra-ocular movements intact.  Sclerae are clear. Her skin is pale at its baseline.  NECK: Supple. Carotid upstrokes are equal bilaterally without bruits.  There is no JVD.  Thyroid is not enlarged. Trachea is midline.  LUNGS:  Reveal decreased breath sounds throughout.  HEART: Reveals normal PMI. Normal S1, S2.  ABDOMEN: Soft with good bowel sounds. There is no midline bruit.  EXTREMITIES: Reveals no cyanosis, clubbing or edema. Pulses are 1+/4+  bilaterally symmetrical. She has varicose veins. There is no sign of  DVT.  SKIN: Is unremarkable.   ASSESSMENT/PLAN:  [name] is  doing well. She is due an adenosine  Myoview and abdominal ultrasound. We will schedule those for followup.  Assuming these are stable, I will see her back in a year.     Thomas C. Daleen Squibb, MD, [facility]  Electronically Signed    TCW/MedQ  DD: 03/02/2007  DT: 03/02/2007  Job #: [id number]   cc:   Kirk Ruths, M.D.

## 2010-11-14 NOTE — Op Note (Signed)
[name], BAHNER                ACCOUNT NO.:  [id number]   MEDICAL RECORD NO.:  [id number]          PATIENT TYPE:  AMB   LOCATION:  SDS                          FACILITY:  [facility]   PHYSICIAN:  Quita Skye. Hart Rochester, M.D.  DATE OF BIRTH:  [birth year]   DATE OF PROCEDURE:  05/14/2005  DATE OF DISCHARGE:                                 OPERATIVE REPORT   PREOP DIAGNOSIS:  Abdominal aortic aneurysm.   POSTOPERATIVE DIAGNOSIS:  Abdominal aortic aneurysm.   PROCEDURE:  Abdominal aortogram (biplane abdominal aortogram with bilateral  lower extremity runoff the right common femoral approach).   SURGEON:  Dr. Hart Rochester.   ANESTHESIA:  Local Xylocaine.   CONTRAST:  170 mL.   COMPLICATIONS:  None.   DESCRIPTION OF PROCEDURE:  The patient was taken to the [facility]  peripheral endovascular placed in supine position at which time the right  groin was prepped Betadine scrub and solution and draped in routine sterile  manner. After infiltration of 1% Xylocaine, right common femoral artery was  entered percutaneously. Guidewire passed into the suprarenal aorta under  fluoroscopic guidance. 5-French sheath and dilator were passed over the  guide wire and dilator removed. The standard graduated pigtail catheter  positioned in the suprarenal aorta. Flush abdominal aortogram was performed  injecting 20 mL contrast at 20 mL per second. This revealed the aorta to be  widely patent. There was an aneurysmal bulge at the origin of a chronically  occluded right renal artery which was about 1 cm x 13 mm in diameter. Left  renal artery was widely patent. There was an infrarenal aortic aneurysm with  a conical neck leading to this and a calcified rim. Both common internal and  external iliac arteries were widely patent although slightly tortuous on the  left side. Lateral aortogram was performed revealing widely patent superior  mesenteric artery and no acute angulation involving the neck of the  aneurysm.  Catheter was withdrawn into the terminal aorta and bilateral lower  extremity runoff performed injecting 88 mL of contrast at 8 mL per second.  This revealed both common iliac arteries as noted to be widely patent as  were the external iliac arteries and internal iliac arteries. The profunda  femoris and superficial femoral arteries bilaterally were widely patent and  normal in appearance as were the popliteal arteries. There was diffuse  tibial disease and both legs with the anterior tibial and the posterior  tibial arteries being small with best vessel bilaterally being the peroneal  artery. Following this, additional views of the iliac arteries were obtained  in the RAO and LAO projections injecting 15 mL of contrast on each occasion  revealing some tortuosity at the origin of the left common iliac artery and  some mild narrowing at the origin of the right common iliac artery and the  40% range. Having tolerated procedure well, the sheath was removed, adequate  compression applied.  No complications ensued.   FINDINGS:  1.  Infrarenal aortic aneurysm.  2.  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  3.  Occluded inferior  mesenteric artery.  4.  Widely patent iliac arteries bilaterally.  5.  Diffuse tibial disease with best runoff bilaterally being peroneal      artery with small posterior tibial anterior tibial arteries bilaterally.           ______________________________  Quita Skye Hart Rochester, M.D.     JDL/MEDQ  D:  05/14/2005  T:  05/14/2005  Job:  [id number]

## 2010-11-14 NOTE — Op Note (Signed)
[name], [name]                ACCOUNT NO.:  [id number]   MEDICAL RECORD NO.:  [id number]          PATIENT TYPE:  AMB   LOCATION:  DAY                           FACILITY:  [facility]   PHYSICIAN:  R. Roetta Sessions, M.D. DATE OF BIRTH:  12-Aug-1928   DATE OF PROCEDURE:  12/03/2005  DATE OF DISCHARGE:                                 OPERATIVE REPORT   PROCEDURE PERFORMED:  EGD with biopsy.   INDICATIONS FOR PROCEDURE:  The patient is a 77 year old lady with a history  of short-segment Barrett's esophagus.  She is here for surveillance.  This  procedure was discussed with the patient at length.  The potential risks,  benefits and alternatives have been reviewed.  Questions were answered.  She  is agreeable.  Please see documentation on medical record.   PROCEDURE:  Oxygen saturation and blood pressure were monitored.  After  conscious sedation with Versed 3 mg and IV Demerol 50 mg given in divided  doses, the patient was prepped with topical anesthesia.   FINDINGS:  Examination of tubular esophagus revealed a patulous EG junction.  There was about a  2 cm segment of salmon-colored epithelium coming up  above, what appeared to be, the EG junction.  There was no evidence of  esophagitis or neoplasia.  The EG junction was patulous, easily traversed.  Stomach:  The gastric cavity was empty and insufflated well with air .  Examination of the gastric mucosa, retroflexed view of the proximal stomach,  esophagogastric junction demonstrated a small hiatal hernia and a 1 cm area  of EE eroded friable mucosa, with overlying fixed, what appeared to be,  cream-colored eschar (please see the photos).  This would not wash or wipe  off.  This area was biopsied.  The remaining gastric mucosa appeared normal  and patent.  The pylorus was patent and easily traversed.  Examination of  the bulb and second portion revealed no abnormalities.   THERAPEUTIC/DIAGNOSTIC MANEUVERS PERFORMED:  As stated above, area  of  gastric mucosal abnormalities were biopsied.  Subsequent biopsies of the  salmon-colored epithelium and distal esophagus were also taken multiple.   The patient tolerated the procedure well and was reactive.   IMPRESSION:  Short segment of salmon-colored epithelium coming up into the  distal tubular esophagus.  Patchulous CD junction, status post biopsy.  A  small hiatal hernia.  Area of erosion with possible focal ulceration, with  overlying eschar exudate of uncertain significance.  Posterior gastric wall  biopsy and remaining gastric mucosa appeared normal.  Patent pylorus and  normal E1 and D2.   RECOMMENDATIONS:  1.  Follow up on pathology.  2.  Continue Aciphex 20 mg daily.  3.  Further recommendations to follow.      Jonathon Bellows, M.D.  Electronically Signed     RMR/MEDQ  D:  12/03/2005  T:  12/03/2005  Job:  [id number]   cc:   Kirk Ruths, M.D.  Fax: [phone]

## 2010-11-14 NOTE — Cardiovascular Report (Signed)
[place]  Patient:    [name], [name]                         MRN: [id number] Proc. Date: 11/23/00 Attending:  Everardo Beals. Juanda Chance, M.D. [facility] CC:         Yetta Numbers, M.D.  Thomas C. Wall, M.D. [facility]  Cardiopulmonary Lab   Cardiac Catheterization  PROCEDURE:  Cardiac catheterization and stent implantation.  CARDIOLOGISTEverardo Beals Juanda Chance, M.D. [facility]  CLINICAL HISTORY:  [name] is 77 years old and has no prior history of known heart disease.  She was hospitalized here more than a year ago with chest pain and was treated with Integrilin as part of the PURSUIT trial and developed pericardial tamponade which was thought to be due to a small aortic dissection.  However, her symptoms resolved, and this was not further evaluated as best I can tell.  She also has an abdominal aortic aneurysm. Recently, she has had exertional chest pain and was seen by Dr. Juanito Doom and was scheduled for evaluation with angiography.  PROCEDURE IN DETAIL:  The procedure was performed via the right femoral artery using arterial sheath and 6-French preformed coronary catheters.  A frontal arterial puncture was performed, and Omnipaque contrast was used.  An aortic root injection was performed to assess any residual aortic dissection.  A distal abdominal aortogram was performed to evaluate her previously noted abdominal aortic aneurysm.  She tolerated the procedure well.  After completion of the diagnostic study, we made the decision to proceed with intervention on the right coronary artery.  The patient had been consented and enrolled in the TAXUS IV trial.  She was given weight-adjusted heparin for an ACT greater than 200 seconds and was given double bolus Integrilin and infusion.  We used a JR4 7-French guiding catheter with side holes and a short Luge wire.  We crossed the lesion in the mid right coronary artery with the wire without difficulty.  We predilated with a 2.5  x 15 mm Quantum Ranger, performing one inflation of 8 atmospheres for 24 seconds.  We then deployed a 2.5 x 15 mm TAXUS stent as part of the TAXUS protocol, deploying this with one inflation at 15 atmospheres for 45 seconds.  Repeat diagnostic study was then performed through the guiding catheter.  The patient tolerated the procedure well and left the laboratory in satisfactory condition.  RESULTS:  The aortic pressure was 140/60 with a mean of 90.  Left ventricular pressure was 140/11.  Left main coronary artery was free of significant disease.  Left anterior descending artery gave rise to a diagonal branch and two sets of perforators and a third diagonal branch.  The LAD was irregular.  There was no significant obstruction.  The circumflex artery gave rise to a marginal branch and two posterolateral branches.  There was 40% narrowing in the marginal branch.  The right coronary artery was a small to moderate size vessel that gave rise to a right ventricular branch, a posterior descending branch, and a small posterolateral branch.  There was 90% stenosis in the mid vessel.  There was 40% narrowing in the distal vessel near the posterior descending branch.  The left ventriculogram performed in the RAO projection showed good wall motion with no areas of hypokinesis.  The estimated ejection fraction was 60%.  Following PTCA and stenting of the mid right coronary artery lesion, the stenosis improved from 90% to 0%.  There was no dissection seen.  ADDENDUM:  A distal aortogram was performed which showed occlusion of the right renal artery and a moderate size abdominal aortic aneurysm located just below the kidneys and extending to the bifurcation.  CONCLUSION: 1. Coronary artery disease with a 40% narrowing in the marginal branch of the    circumflex artery, no major obstruction of the left anterior descending    artery, 90% mid and 40% distal stenosis of the right coronary artery  with    normal left ventricular function. 2. Moderate abdominal aortic aneurysm. 3. Successful stenting of the mid right coronary artery lesion with    improvement narrowing from 90% to 0%. 4. 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  DISPOSITION:  The patient returned to the holding area for further observation. DD:  11/23/00 TD:  11/23/00 Job: [id number]

## 2010-11-14 NOTE — Consult Note (Signed)
[name], [name]                ACCOUNT NO.:  [id number]   MEDICAL RECORD NO.:  [id number]          PATIENT TYPE:  OUT   LOCATION:  RDC                           FACILITY:  [facility]   PHYSICIAN:  R. Roetta [name], M.D. DATE OF BIRTH:  Jan 14, 1929   DATE OF CONSULTATION:  04/22/2005  DATE OF DISCHARGE:  03/18/2005                                   CONSULTATION   GASTROINTESTINAL CONSULTATION   CHIEF COMPLAINT:  Followup for refills for acid reflux.   PRIMARY CARE PHYSICIAN:  [name], M.D.   HISTORY OF PRESENT ILLNESS:  [name] is a 77 year old Caucasian female  patient of [name] who presents today, basically for a followup of her  acid reflux.  We last saw her at time of upper endoscopy in August 2004. She  was having surveillance EGD given history of Barrett's esophagus.  She  continued to have salmon-colored epithelium consistent with Barrett's  esophagus.  The remainder of her study was normal.  Biopsies were benign.  She is due for a followup study in August 2007.  She says that she has been  doing reasonably well, as long as she takes her Aciphex she has no  heartburn.  Denies any dysphagia, odynophagia, constipation, diarrhea,  melena, or rectal bleeding.  She complains, however, of early satiety which  is seen to be getting worse.  She feels like she has a pressure in her  abdomen which is causing her to feel full.  She has a history of abdominal  aortic aneurysm as well as a dissected aorta in the past.  Her last  ultrasound was about 4 years ago.  She complains of once-a-month having  extreme gas with lower abdominal gas-type pains which last for about a day  at a time.   CURRENT MEDICATIONS:  1.  Metoprolol 50 mg b.i.d.  2.  Aciphex 20 mg daily.  3.  Accupril 20 mg daily.  4.  Aspirin 325 mg daily.  5.  Lipitor 10 mg q.h.s.  6.  Zetia 10 mg daily.  7.  Claritin 10 mg p.r.n.  8.  __________ pyridoxine 1 daily.   ALLERGIES:  No known drug  allergies.   PAST MEDICAL HISTORY:  1.  Hypertension.  2.  Hypercholesterolemia.  3.  Gastroesophageal reflux disease with history of Barrett's esophagus.      Last EGD as outlined above.  4.  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      She had a pericardial window performed.  5.  She has had periodic followups of abdominal aortic aneurysm, but the      last one was done about 4 years ago.  6.  Coronary stent placed in 2000.  She has had 3 laminectomies.   FAMILY HISTORY:  Mother died of ruptured carotid artery at age 68.  Father  died of endocarditis at age 23.  A brother died at age 53 of aortic aneurysm  rupture.   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 She quit smoking 11 years ago, occasionally consumes  alcohol.  She has been married for 47 years and has 4 sons.   REVIEW OF SYSTEMS:  Please see HPI for GI.  CONSTITUTIONAL:  Denies any  weight loss.  CARDIOPULMONARY:  Denies any chest pain or shortness of  breath.   PHYSICAL EXAMINATION:  VITAL SIGNS:  Weight 136, Height 5 feet 3-1/2 inches.  Temperature 97.4, blood pressure 118/70, pulse 68.  GENERAL:  A pleasant, well-nourished, well-developed, elderly, Caucasian  female in no acute distress.  SKIN:  Warm and dry.  No jaundice.  HEENT:  Conjunctivae are pink.  Sclerae anicteric.  Oropharyngeal mucosa  moist and pink.  No lesions, erythema or exudate.  No lymphadenopathy or  thyromegaly.  CHEST:  Lungs are clear to auscultation.  CARDIOVASCULAR:  Cardiac exam reveals regular rate and rhythm.  Normal S1,  S2.  No murmurs, rubs, or gallops.  ABDOMEN:  Positive bowel sounds, soft, nondistended.  No organomegaly or  masses.  No rebound tenderness or guarding.  No abdominal bruits or hernias.  EXTREMITIES:  No edema.   IMPRESSION:  [name] is a 77 year old lady with history of chronic  obstructive gastroesophageal reflux disease and  Barrett's esophagus with  last esophagogastroduodenoscopy in August 2004 as outlined above.  She is  here for a followup, but complains of early satiety. She has postprandial  abdominal discomfort as well.  She gives a history of abdominal aortic  aneurysm.  It has been 4 years since her last abdominal ultrasound for  surveillance purposes.  Given her postprandial abdominal discomfort and  early satiety.  We would like to proceed with abdominal ultrasound at this  time.  If this is negative, would recommend upper endoscopy.   PLAN:  1.  Will retrieve records from [name] office.  2.  Hemoccult stool x3.  3.  Continue Aciphex 20 mg daily; #20 samples given.  4.  Abdominal ultrasound in the near future.  5.  Further recommendations to follow.      [name], P.AJonathon [name], M.D.  Electronically Signed    LL/MEDQ  D:  04/22/2005  T:  04/22/2005  Job:  [id number]   cc:   [name], M.D.  Fax: [phone]

## 2010-11-14 NOTE — H&P (Signed)
[name], [name]                ACCOUNT NO.:  [id number]   MEDICAL RECORD NO.:  [id number]          PATIENT TYPE:  INP   LOCATION:  NA                           FACILITY:  [facility]   PHYSICIAN:  Quita Skye. Hart Rochester, M.D.  DATE OF BIRTH:  [birth year]   DATE OF ADMISSION:  06/12/2005  DATE OF DISCHARGE:                                HISTORY & PHYSICAL   CHIEF COMPLAINT:  Aneurysm.   HISTORY OF PRESENT ILLNESS:  The patient is a 77 year old Caucasian female  with known history of abdominal aneurysm that has been followed for several  years.  Recent ultrasound done in October 2006 confirmed the aneurysm had an  increase in size to 5 x 4.8 cm in maximum diameter.  This was subsequently  confirmed by CT and angiogram as well as arteriography. Arteriogram done by  Dr. Hart Rochester May 14, 2005, confirmed the large aneurysm as well as an  occluded inferior mesenteric artery, occluded right renal artery with  aneurysmal bulge at origin, and widely patent iliac arteries bilaterally.  The patient did have some peripheral disease with diffuse tibial disease and  best runoff bilaterally being the peroneal artery with small posterior  tibial and the anterior tibial arteries bilaterally.  The patient denies  abdominal pain, nausea, vomiting, constipation, hematochezia, hematemesis,  peripheral edema, dysuria, hematuria, angina, palpitations, TIA or CVA  symptoms.  She does have occasional back pain and has known lumbar disease  status post surgical repair.  She also has some mild claudication symptoms  and significant reflux symptoms.  She was not felt to be a candidate for  stent grafting based on measurements.  She will be admitted this  hospitalization electively for abdominal aortic aneurysm resection and  grafting on June 12, 2005.   PAST MEDICAL HISTORY:  1.  Coronary artery disease.  2.  Abdominal aortic aneurysm.  3.  Arthritis.  4.  Sinusitis.  5.  Gastroesophageal reflux with history  of Barrett's esophagus.  6.  Kidney stones in 1968.   PAST SURGICAL HISTORY:  1.  Lumbar disk.  2.  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  3.  Previous right coronary artery stenting.   ALLERGIES:  No known drug allergies.   CURRENT MEDICATIONS:  1.  Aciphex 20 mg daily.  2.  Quinapril 20 mg daily.  3.  Pyridoxine 1 daily.  4.  Metoprolol 100 mg in the a.m., 50 mg in the p.m.  5.  Aspirin 325 mg daily.  6.  Lipitor 10 mg at bedtime.  7.  Zetia 10 mg at bedtime.   REVIEW OF SYMPTOMS:  See History of Present Illness for pertinent positives  and negatives.  Otherwise, remarkable for occasional gas pain and bloating-  type sensation.   SOCIAL HISTORY:  She is married.  She has four sons.  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  Previous tobacco use,  approximately 42  years worth.  She quit in 1995.  Alcohol use is occasional.   FAMILY HISTORY:  She has a brother who is deceased after a thoracic aortic  aneurysm rupture.  She has one brother who has had two myocardial  infarctions.  Her father had history of endocarditis.   PHYSICAL EXAMINATION:  VITAL SIGNS: Blood pressure 118/60, heart rate 68,  respirations 16 and unlabored.  GENERAL APPEARANCE: A 77 year old white female in no acute distress.  HEENT:  Normocephalic and atraumatic.  Pupils equal, round, and reactive to  light.  Extraocular movements intact.  Oral mucosa pink and moist.  There  are no pharyngeal exudates or erythema.  Sclerae anicteric.  She has upper  dentures and partial lower plate.  NECK:  Supple.  No jugular venous distention, no carotid bruits, no  lymphadenopathy.  She does have moderate thyromegaly with no obvious  nodularity.  PULMONARY: Symmetric on inspiration, unlabored. Clear breath sounds,  although diminished air exchange in the bases.  No  wheezes, rhonchi, or  crackles.  CARDIAC:  Regular rate and rhythm.  No murmurs, gallops, or rubs.  ABDOMEN:  Soft, nontender, nondistended.  Normoactive bowel sounds.  There  is a mid abdominal and epigastric pulsatile mass consistent with the  diagnosis of abdominal aortic aneurysm.  No hepatosplenomegaly.  Moderately  overweight.  GENITOURINARY AND RECTAL: Deferred.  EXTREMITIES:  No edema, no varicosities, no venous stasis changes, no skin  breakdown or ulcerations.  No cyanosis, no mottling. Temperature is warm to  the touch.  PERIPHERAL PULSES: Palpable femoral bilaterally.  Palpable dorsalis pedis on  the right, absent pedal pulses otherwise on the left and absent posterior  tibial bilaterally.  NEUROLOGIC:  Nonfocal.  Alert and oriented x4.  Gait is steady.  Muscle  strength is 5+ and equal bilaterally.   ASSESSMENT:  1.  Infrarenal abdominal aortic aneurysm.  2.  Query thyromegaly.   Other diagnoses as previously listed per the history.  Plan is for resection  and grafting per Dr. Hart Rochester.      Rowe Clack, P.A.-C.    ______________________________  Quita Skye Hart Rochester, M.D.    Sherryll Burger  D:  06/10/2005  T:  06/10/2005  Job:  [id number]   cc:   Thomas C. Wall, M.D.  [address]  [address]  [place]  [place] [address]

## 2010-11-14 NOTE — Discharge Summary (Signed)
NAMEDALORES, WEGER                ACCOUNT NO.:  [id number]   MEDICAL RECORD NO.:  [id number]          PATIENT TYPE:  INP   LOCATION:  2021                         FACILITY:  [facility]   PHYSICIAN:  Quita Skye. Hart Rochester, M.D.  DATE OF BIRTH:  [birth year]   DATE OF ADMISSION:  06/12/2005  DATE OF DISCHARGE:                                 DISCHARGE SUMMARY   PRIMARY DIAGNOSIS:  Infrarenal abdominal aortic aneurysm.   IN-HOSPITAL DIAGNOSIS:  Postoperative supraventricular tachycardia.   SECONDARY DIAGNOSES:  1.  History of coronary artery disease, status post right coronary artery      stenting.  2.  History of arthritis.  3.  History of sinusitis.  4.  Gastroesophageal reflux disease with history of Barrett's esophagus.  5.  History of nephrolithiasis.  6.  Status post surgery on lumbar disk.  7.  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 Andrey Campanile in 1995.   ALLERGIES:  No known drug allergies.   IN-HOSPITAL OPERATIONS AND PROCEDURES:  Resection and grafting of infrarenal  abdominal aortic aneurysm with insertion of an aortobi-external iliac graft  using 14 x 7 mm Hemashield graft.   HISTORY AND PHYSICAL AND HOSPITAL COURSE:  The patient is a 77 year old  Caucasian female with known history of abdominal aortic aneurysm that has  been followed for several years.  Recent ultrasound done in October 2006  confirmed aneurysm had an increased to size to 5 x 4 cm in maximum diameter.  This was subsequently confirmed by CT and angiogram as well as  arteriography.  Arteriogram done by Dr. Hart Rochester May 14, 2005, confirmed  a large aneurysm as well as an occluded inferior mesenteric artery, occluded  right renal artery with aneurysmal bulge at origin, with widely patent iliac  arteries bilaterally.  The patient did have some peripheral disease with  diffuse tibial disease and runoff bilateral via the peroneal artery  with  small posterior tibial and anterior tibial arteries bilaterally.  The  patient denies any TIA or CVA symptoms.  She was seen and evaluated by Dr.  Hart Rochester.  Dr. Hart Rochester discussed with patient undergoing AAA repair.  He  discussed risks and benefits of the procedure.  The patient acknowledged her  understanding and wished to proceed.  Surgery was scheduled for June 12, 2005.   The patient was taken to the operating room June 12, 2005, where she  underwent resection and grafting of infrarenal abdominal aortic aneurysm  with insertion of an aortobi-external iliac graft using a 14 x 7 mm  Hemashield graft.  The patient tolerated the procedure well and was  transferred up to the intensive care unit in stable condition.  Following  surgery the patient was seen to be hemodynamically stable.  She was noted to  have 2-3+ femoral pulses with 2+ DP/PT pulses bilaterally.  The patient's  postoperative course was pretty much unremarkable.  She remained  hemodynamically stable postoperatively.  The patient was transferred down to  2000 postoperative day 1.  She was out of  bed ambulating well.  NG was  removed postoperative day 1.  The patient tolerated well and no nausea or  vomiting.  The patient was started on clear liquid diet postoperative day 2.  She did well with this and was advanced to full liquids and then increased  to a regular diet by postoperative day 1.  The patient was without nausea or  vomiting.  The patient had positive flatus and bowel movement by  postoperative day 3.  Her incisions remained dry and intact and healing  well.  She was able to be weaned off oxygen, saturating greater than 90%  prior to discharge home.   Note that the patient did go into postoperative SVT beginning on  postoperative day 2.  She was started on Cardizem drip, which brought heart  rates down to the 90s.  She was continued on a Cardizem drip until  tolerating p.o.'s.  The patient was also  restarted on her metoprolol home  dose.  She was seen to remain in SVT.  Dr. Daleen Squibb was consulted.  Dr. Daleen Squibb  evaluated the patient June 17, 2005, where he discontinued her IV  Cardizem and increased her beta blocker.  December 21, the patient was in  sinus tachycardia, rate of 110, blood pressure increased again.  This will  be monitored and evaluated again in the morning.   The patient had postoperative ABIs done which showed right and left both  being greater than 1.0.   The patient tentatively ready for discharge postoperative day 6, June 19, 2005.  Discharge will depend if patient remains in normal sinus rhythm.  A follow-up appointment will be scheduled with Dr. Hart Rochester for in three  weeks.  The patient will follow up with a nurse July 02, 2005, at 9 a.m.  At this appointment she will have her staples removed.  She received  instructions on diet, activity level and incisional care.  She was told no  driving until released to do so.  The patient was told no heavy lifting  until released to do so.  She was told she was allowed to shower, washing  her incisions using soap and water.  She is to contact the office if she  develops any drainage or opening from any of her incision sites or fever  greater than 101.1 degrees Fahrenheit.  The patient was told to ambulate  three to four times per day, progress as tolerated, and to continue her  breathing exercises.  She was educated on diet to be low-fat, low-salt.   DISCHARGE MEDICATIONS:  1.  Aciphex 20 mg daily.  2.  Lopressor 100 mg b.i.d.  3.  Zetia 10 mg q.h.s.  4.  Lipitor 10 mg q.h.s.  5.  Pyridoxine daily.  6.  Tylox one to two tablets p.o. q.4-6h. p.r.n. pain.      Theda Belfast, PA    ______________________________  Quita Skye Hart Rochester, M.D.    KMD/MEDQ  D:  06/18/2005  T:  06/20/2005  Job:  [id number]   cc:   Thomas C. Wall, M.D.  [address]  [address]  [place]  [place] [address]

## 2010-11-14 NOTE — H&P (Signed)
[name], [name]                ACCOUNT NO.:  [id number]   MEDICAL RECORD NO.:  [id number]          PATIENT TYPE:  AMB   LOCATION:  AMB                           FACILITY:  [facility]   PHYSICIAN:  R. Roetta Sessions, M.D. DATE OF BIRTH:  [birth year]   DATE OF ADMISSION:  DATE OF DISCHARGE:  LH                                HISTORY & PHYSICAL   CHIEF COMPLAINT:  Need for surveillance for Barrett's esophagus.   HISTORY OF PRESENT ILLNESS:  [name] is a pleasant 77 year old  Caucasian female with a history of Barrett's esophagus.  We saw this lady  back in October of 2006, and she was having some postprandial upper  abdominal discomfort.  We noted that she had a history of a AAA, with the  last imaging done four years earlier.  We proceeded with an abdominal  ultrasound which revealed significant enlargement of the aortic aneurysm  with a relatively large span longitudinally of 7.7 cm.  We ultimately  referred her to Dr. Hart [name] down at [facility] who agreed that repair was in order.  She apparently was not a candidate for an endovascular stent, and she  underwent resection on June 12, 2005. Apparently, she has done great,  and Dr. Hart [name] has basically discharged her from followup, as she is doing  so well.  It is interesting to note that her postprandial upper abdominal  pain and bloating have resolved.  Her reflux symptoms are well-controlled on  Aciphex 20 mg orally daily.  She had a colonoscopy which revealed a normal  rectum and colon in 2002.  She is not due for a followup screening  colonoscopy until 2012.  Her EGD back in 2002 demonstrated Barrett's  esophagus, biopsy-proven; however, in 2004, she appeared to have Barrett's,  but biopsies just revealed cholangial epithelium without specialized  columnar epithelium or goblet cells consistent with typical Barrett's.  She  is not having any dysphagia.   PAST MEDICAL HISTORY:  1.  Hypertension.  2.  Hypercholesterolemia.  3.  Gastroesophageal reflux disease with Barrett's.  4.  History of a dissecting aneurysm in her chest in 1995 requiring surgery.  5.  History of pericardial window performed at that time and coronary stent      placed in 2000.  6.  Status post three laminectomies.   CURRENT MEDICATIONS:  1.  Metoprolol 100 mg b.i.d.  2.  Aciphex 20 mg daily.  3.  Lipitor 10 mg daily.  4.  Zetia 10 mg at bedtime.   ALLERGIES:  NO KNOWN DRUG ALLERGIES.   FAMILY HISTORY:  Mother died of a ruptured carotid artery aneurysm.  Father  died of endocarditis at age 81.  Brother died at age 80 with aortic aneurysm  rupture.   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  She quit smoking 12 years ago.  She occasionally  consumes alcoholic beverages.  She has been married for 48 years and has  four sons.   REVIEW OF SYSTEMS:  No chest pain.  No  dyspnea on exertion.  Her weight is  down from 136 back in October of 2006 to 123 today.   PHYSICAL EXAMINATION:  GENERAL:  She appears in no acute distress.  VITAL SIGNS:  Weight 123, height 5 feet 3 inches.  Temperature 97.5, blood  pressure 118/64, pulse 64.  SKIN:  Warm and dry.  No jaundice.  HEENT:  No scleral icterus.  Conjunctivae are pink.  CHEST:  Lungs are clear to auscultation.  CARDIAC:  Regular rate and rhythm without murmurs, rubs, or gallops.  ABDOMEN:  Nondistended.  She has a long laparotomy scar, well-healed.  Positive bowel sounds.  Soft, nontender.  Without appreciable masses or  organomegaly.  EXTREMITIES:  No edema.   IMPRESSION:  [name] is a pleasant 77 year old lady who has been  through quite an ordeal with her aortic aneurysm resection back in the end  of 2006; however, she has done extremely well.  She has a history of  Barrett's esophagus.  Her gastroesophageal reflux disease symptoms are well-  controlled on Aciphex.  Biopsies in 2002 confirmed typical Barrett's   esophagus, but in 2004, they did not (I suspect the later biopsies are a  sampling error and certainly nothing concerning endoscopically).   RECOMMENDATIONS:  She needs to go ahead and have a surveillance EGD now.  The potential risks, benefits, and alternatives have been reviewed and  questions answered.  She is agreeable.   PLAN:  We will perform an EGD in the very near future and further  recommendations to follow.      Jonathon Bellows, M.D.  Electronically Signed     RMR/MEDQ  D:  11/19/2005  T:  11/19/2005  Job:  [id number]   cc:   Kirk Ruths, M.D.  Fax: [phone]   Quita Skye. [name], M.D.  [address]  [place]  [place] [address]

## 2010-11-14 NOTE — Op Note (Signed)
[name], TARLETON                ACCOUNT NO.:  [id number]   MEDICAL RECORD NO.:  [id number]          PATIENT TYPE:  INP   LOCATION:  2021                         FACILITY:  [facility]   PHYSICIAN:  Quita Skye. Hart Rochester, M.D.  DATE OF BIRTH:  Jun 19, 1929   DATE OF PROCEDURE:  06/12/2005  DATE OF DISCHARGE:                                 OPERATIVE REPORT   PREOP DIAGNOSIS:  Infrarenal abdominal aortic aneurysm.   POSTOP DIAGNOSIS:  Infrarenal abdominal aortic aneurysm.   OPERATION:  Resection and grafting of infrarenal abdominal aortic aneurysm  with insertion of an aortobiexternal iliac graft using a 14 x 7 mm  Hemashield graft.   SURGEON:  Quita Skye. Hart Rochester, M.D.   FIRST ASSISTANT:  Jerold Coombe, P.A.   ANESTHESIA:  General endotracheal.   DESCRIPTION OF PROCEDURE:  Th patient was taken to the operating room,  placed in the supine position at which time satisfactory general  endotracheal anesthesia was administered. The abdomen and groins were  prepped with Betadine scrub and solution and draped in routine sterile  manner. A Swan-Ganz catheter and a radial arterial line were inserted by  anesthesia preoperatively. A midline incision was made in the abdomen from  the xiphoid to just below the umbilicus, carried down through the  subcutaneous tissue and linea alba using the bovie.   Peritoneal cavity was entered and thoroughly explored. The stomach,  duodenum, small bowel, and colon were unremarkable. The liver was smooth. No  palpable masses. Gallbladder appeared normal. No stones were palpable. The  transverse colon was elevated and the intestines reflected to the right  side. Retroperitoneum was exposed and there was a 5-cm, infrarenal aneurysm  extending from the renal arteries to the bifurcation. The neck of the  aneurysm was dissected free just distal to the left renal artery. The right  renal artery was occluded chronically. The inferior mesenteric artery was  occluded at  its origin. Common iliac arteries were diffusely calcified down  to the bifurcation and both were encircled for umbilical tapes. The external  iliac arteries were exposed distal to the ureter on the left and proximal to  the ureter on the right.  The patient was then given 25 grams of mannitol  and heparinized.   Aorta was occluded distal to the renal arteries. Both common iliac arteries  ligated with umbilical tapes. The aneurysm was opened anteriorly.  A  moderate amount laminated thrombus removed. The neck of the aneurysm was  transected 2 cm below the left renal artery. There was calcification  circumferentially in the neck of the aneurysm. A full-thickness  endarterectomy was not performed because of the very thin wall. A 14 mm  Hemashield Dacron graft was anastomosed end-to-end to the aortic stump using  continuous 3-0 Prolene. This was buttressed with felt. A few leaks were  repaired with 4-0 Prolene sutures.   Following this, end-to-side anastomoses were done to the external iliac  arteries bilaterally. The left leg was opened initially followed by the  right leg with no significant hypotension. Protamine was given to reverse  the heparin. Following adequate  hemostasis aneurysm closed over the graft  with 3-0 Vicryl. Retroperitoneum approximated with  3-0 Vicryl. The fascia with #1 Prolene and the skin with clips. Sterile  dressing applied. The patient taken to recovery room in satisfactory  condition. The patient received 1 unit of blood from the Cell Saver; no bank  blood; and was stable, hemodynamically, throughout the case.           ______________________________  Quita Skye. Hart Rochester, M.D.     JDL/MEDQ  D:  06/12/2005  T:  06/15/2005  Job:  [id number]

## 2010-11-14 NOTE — Op Note (Signed)
NAME:  [name], [name]                          ACCOUNT NO.:  [id number]   MEDICAL RECORD NO.:  [id number]                   PATIENT TYPE:  AMB   LOCATION:  DAY                                  FACILITY:  [facility]   PHYSICIAN:  R. Roetta Sessions, M.D.              DATE OF BIRTH:  Jun 13, 1929   DATE OF PROCEDURE:  02/07/2003  DATE OF DISCHARGE:                                 OPERATIVE REPORT   PROCEDURE:  Esophagogastroduodenoscopy with biopsy.   INDICATIONS FOR PROCEDURE:  The patient is a 77 year old with a history of  short-segment Barrett's esophagus.  Last EGD 2002.  Biopsies confirmed  Barrett's.  No dysplasia.  She has done well, without any reflux symptoms on  Aciphex 20 mg daily.  No dysphagia, etc.  EGD is now being done as a  surveillance maneuver.  This approach has been discussed with the patient at  the bedside.  The potential risks, benefits, and alternatives have been  reviewed and questions answered.  Please see my handwritten H&P.   PROCEDURE:  O2 saturation, blood pressure, pulses, and respirations were  monitored throughout the entire procedure.  Conscious sedation was with  Versed 2 mg IV, Demerol 25 mg IV in divided doses.  The instrument used was  the Olympus video chip adult gastroscope.   FINDINGS:  Examination of the tubular esophagus again revealed the salmon-  colored epithelium extending a couple of centimeters up from the anatomic EG  junction in a circumferential distribution.  There was a skinny tongue of  salmon-colored epithelium extending 3 cm above this into the distal  esophagus.  Please see photos.  The esophagus mucosa otherwise appeared  normal.  The EG junction was easily traversed.   Stomach:  The gastric cavity was empty and insufflated well with air.  Thorough examination of the gastric mucosa including retroflex view of the  proximal stomach and esophagogastric junction demonstrated no abnormalities.  The pylorus was patent and easily  traversed.   Duodenum:  Examination of the bulb and second portion revealed no  abnormalities.   THERAPEUTIC/DIAGNOSTIC MANEUVERS PERFORMED:  Biopsies of the distal  esophagus were taken for histologic study.  The patient tolerated the  procedure well and was reactive in endoscopy.   IMPRESSION:  Salmon-colored epithelium.  No significant change from prior  endoscopy.  Consistent with Barrett's esophagus.  Biopsied.  The remainder  of the esophagus appeared normal.  Normal stomach.  Normal first and second  portions of the duodenum.    RECOMMENDATIONS:  1. Continue Aciphex 20 mg orally daily.  2. Follow up on pathology.  3. Further recommendations to follow.                                               Jonathon Bellows,  M.D.    RMR/MEDQ  D:  02/07/2003  T:  02/07/2003  Job:  [id number]   cc:   Kirk Ruths, M.D.  [address]  [place]  [place] [address]  Fax: [phone]

## 2010-11-14 NOTE — Assessment & Plan Note (Signed)
[facility]                              CARDIOLOGY OFFICE NOTE   NAME:[name], [name]                       MRN:          [id number]  DATE:02/26/2006                            DOB:          15-Sep-1928    Ms. No returns today for further management of the following issues:   1. Coronary artery disease.  Preop clearance for abdominal aortic aneurysm      by Dr. Hart Rochester was negative for ischemia with ejection fraction of 83%      on May 07, 2005.  2. Peripheral vascular disease, status post spontaneous aortic dissection      with pericardial tamponade, status post emergency pericardiocentesis,      exploratory surgery and repair by Dr. Kennon Portela 12 years ago.  3. Hyperlipidemia.  This is being followed by Dr. Regino Schultze.  4. Hypertension.   Unfortunately, after her surgery, her aspirin was not started back nor her  ACE inhibitor.  The rest of her medications are listed on the maintenance  medication list.   PHYSICAL EXAMINATION:  VITAL SIGNS:  Blood pressure today is 160/72, pulse  62 and regular, weight 122 down 14 pounds.  NECK:  Carotids are full.  There is no bruit, no JVD.  Thyroid is not  enlarged.  Trachea is midline.  LUNGS:  Clear.  CHEST:  Normal S1, S2 without gallop, rub or murmur.  ABDOMEN:  Soft, good bowel sounds.  EXTREMITIES:  No clubbing, cyanosis or edema.  Pulses are 1+ dorsalis pedis.  NEUROLOGIC:  Intact.   EKG is essentially normal.   ASSESSMENT:  Ms. Been seems to be doing well from a cardiovascular  standpoint.  Unfortunately, she was not started back on her aspirin nor her  quinapril and she is hypertensive today.   PLAN:  1. Renew quinapril 20 mg a day.  The patient will keep a close watch on      her blood pressures and call us if it is not less than 130/80.  2. Restart aspirin at 81 mg a day.  3. Continue metoprolol, Lipitor and Zetia.  4. I will plan on seeing her back in a year.  At that time, she  will need      a stress Myoview.                               Thomas C. Daleen Squibb, MD, [facility]    TCW/MedQ  DD:  02/26/2006  DT:  02/26/2006  Job #:  [id number]   cc:   Kirk Ruths, MD  Quita Skye. Hart Rochester, MD

## 2010-11-14 NOTE — Consult Note (Signed)
[name], [name]                ACCOUNT NO.:  [id number]   MEDICAL RECORD NO.:  [id number]          PATIENT TYPE:  INP   LOCATION:  2021                         FACILITY:  [facility]   PHYSICIAN:  Charlton Haws, M.D.     DATE OF BIRTH:  1929-04-20   DATE OF CONSULTATION:  DATE OF DISCHARGE:                                   CONSULTATION   [name] is a pleasant 77 year old patient of Dr. Daleen Squibb.  She has history  of known coronary artery disease.  She had angioplasty and stenting of the  right coronary artery by Dr. Juanda Chance in May of 2002.  She tells me she had a  stress test last year and again this October prior to her abdominal aortic  aneurysm surgery.  She was admitted on December 13 for abdominal aortic  aneurysm surgery.  Her aneurysm was 5 x 4.8 cm in maximal diameter.  Apparently in 2005 while enrolled in the PURSUIT trial she had some sort of  a dissection.  The hospital notes indicated a type 3 dissection but then it  also indicates that Dr. Andrey Campanile had to do a pericardial window on her.   She has not had any further problems with this.  She had her elective  abdominal aortic aneurysm surgery.  There were no complications.  Last night  she had a very isolated episode of PSVT lasting only about 20 beats.   She had a little bit of palpitations with this.  She is on chronic beta  blocker therapy taking Lopressor 100 in the morning and 50 at night.   Her other risk factors include hypertension, hypercholesterolemia.   She has not had any significant chest pain, PND, orthopnea.  She actually  feels quite well postoperative.   She has no known allergies.   Past surgical history also includes lumbar disk surgery, previous tamponade  and pericardial window, right coronary artery stenting.   MEDICATIONS:  1.  Aciphex 20 a day.  2.  Quinapril 20 a day.  3.  Peroxidine 1 mg a day.  4.  Lopressor 100 in the morning, 50 at night.  5.  Aspirin 325 a day.  6.  Lipitor 10 a day.  7.  Zetia 10 a day.   She is married.  She has four sons.  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  She has not smoked in quite some time since 1995.   FAMILY HISTORY:  Remarkable for aortic aneurysm rupture.   PHYSICAL EXAMINATION:  GENERAL:  She was about to bathe.  VITAL SIGNS:  Stable.  Blood pressure 130/70.  She is currently sinus  rhythm, rate of 78 with no PACs or PVCs.  LUNGS:  Clear.  HEART:  Carotids are normal.  There is an S1, S2 with normal heart sounds.  She is status post pericardial window.  ABDOMEN:  She has an abdominal aortic aneurysm scar.  She is taking p.o.  EXTREMITIES:  Distal pulses are intact with trace edema.   Her EKG shows no acute changes with sinus rhythm and  is essentially normal.   Her baseline heart rate is only 60.   She was on a Cardizem drip initially but this appears to have been stopped.   IMPRESSION:  Postoperative isolated and short episode of what appeared to be  PSVT.  Patient is already on a fairly good dose of beta blocker for her age,  resting heart rate only 60.   The patient has no signs of ischemia.  She has not had any significant  congestive heart failure.  She is currently in normal sinus rhythm.  I think  for the time being we will continue her current dose of Lopressor being 100  in the morning and 50 at night.   We will discontinue her Cardizem drip.   Should she have any recurrences we can certainly start p.o. calcium  blockers.  However, she did not initially get her beta blockers in the  perioperative period and I suspect this contributed to her PSVT.  She is  taking p.o. now and I have noted that the surgeons have written for her  normal dose of p.o. Lopressor.   We would be happy to follow the patient along.  There is no evidence of  significant cardiac ischemia and I suspect that this is standard  postoperative atrial arrhythmia.   The patient does not need to be anticoagulated for any  reason and if she  does not have a recurrence I also do not think that an echocardiogram or  blood thinners would be needed.           ______________________________  Charlton Haws, M.D.     PN/MEDQ  D:  06/17/2005  T:  06/17/2005  Job:  [id number]

## 2010-12-30 ENCOUNTER — Telehealth: Payer: Self-pay | Admitting: Cardiology

## 2010-12-30 MED ORDER — QUINAPRIL HCL 20 MG PO TABS: 20.0000 mg | ORAL_TABLET | Freq: Every day | ORAL | Status: DC

## 2010-12-30 NOTE — Telephone Encounter (Signed)
Pt needs refill for quinapril 20mg    Called into [facility] [phone]

## 2011-02-03 ENCOUNTER — Encounter: Payer: Self-pay | Admitting: Cardiology

## 2011-02-25 ENCOUNTER — Other Ambulatory Visit: Payer: Self-pay | Admitting: Cardiology

## 2011-03-02 ENCOUNTER — Other Ambulatory Visit: Payer: Self-pay | Admitting: Cardiology

## 2011-03-03 ENCOUNTER — Ambulatory Visit (INDEPENDENT_AMBULATORY_CARE_PROVIDER_SITE_OTHER): Payer: Medicare Other | Admitting: Cardiology

## 2011-03-03 ENCOUNTER — Encounter: Payer: Self-pay | Admitting: Cardiology

## 2011-03-03 VITALS — BP 110/40 | HR 63 | Resp 14 | Ht 63.0 in | Wt 127.0 lb

## 2011-03-03 DIAGNOSIS — E785 Hyperlipidemia, unspecified: Secondary | ICD-10-CM

## 2011-03-03 DIAGNOSIS — Z8679 Personal history of other diseases of the circulatory system: Secondary | ICD-10-CM

## 2011-03-03 DIAGNOSIS — I2510 Atherosclerotic heart disease of native coronary artery without angina pectoris: Secondary | ICD-10-CM

## 2011-03-03 DIAGNOSIS — I10 Essential (primary) hypertension: Secondary | ICD-10-CM

## 2011-03-03 NOTE — Patient Instructions (Signed)
Your physician recommends that you have lab work drawn at [facility] in [place]  For a bmp, tsh, fasting lipid, liver.  We will call you with your lab results.  Your physician recommends that you schedule a follow-up appointment in: 1 year with Dr. Daleen Squibb

## 2011-03-03 NOTE — Assessment & Plan Note (Signed)
Stable. Check laboratory work including lipids. Follow up in one year.

## 2011-03-03 NOTE — Assessment & Plan Note (Signed)
Overdue labs. Will arrange for them to be drawn in [place].

## 2011-03-03 NOTE — Assessment & Plan Note (Signed)
Improved

## 2011-03-03 NOTE — Assessment & Plan Note (Signed)
No recurrence. 

## 2011-03-03 NOTE — Progress Notes (Signed)
HPI [name] returns today for evaluation and management of her complex cardiac and vascular history.  She continues to do remarkably well. Other than her chronic dyspnea on exertion she is having no other symptoms.  She denies any symptoms of TIAs or mini strokes. She denies any claudication.  She's had some dependent edema but this resolved. Comply with medications and brings her medicine bottles with her today. She has not had blood work in over a year.  EKG shows normal sinus rhythm with low voltage, no change. Past Medical History  Diagnosis Date  . Coronary artery disease   . Hyperlipidemia   . Hypertension   . Cardiac tamponade   . Supraventricular tachycardia   . Ascending aortic aneurysm   . Abdominal aortic aneurysm   . Dyspnea on exertion   . GERD (gastroesophageal reflux disease)   . Early satiety   . Barrett's esophagus   . Loose stools   . Abdominal pain   . Vomiting alone     Past Surgical History  Procedure Date  . Colonoscopy   . Egd with biopsy 12/03/2005  . Abdominal aortic aneurysm repair 06/12/2005  . Abdominal aortogram   . Laminectomies     3    Family History  Problem Relation Age of Onset  . Heart disease Mother   . Diabetes Mother   . Heart failure Father     History   Social History  . Marital Status: Married    Spouse Name: N/A    Number of Children: N/A  . Years of Education: N/A   Occupational History  . Not on file.   Social History Main Topics  . Smoking status: Former Smoker -- 1.0 packs/day for 40 years    Types: Cigarettes    Quit date: 06/29/1993  . Smokeless tobacco: Not on file  . Alcohol Use:   . Drug Use:   . Sexually Active:    Other Topics Concern  . Not on file   Social History Narrative  . No narrative on file    No Known Allergies  Current Outpatient Prescriptions  Medication Sig Dispense Refill  . acetaminophen (TYLENOL) 325 MG tablet Take 650 mg by mouth every 6 (six) hours as needed.        Marland Kitchen  aspirin 81 MG tablet Take 81 mg by mouth daily.        Marland Kitchen atorvastatin (LIPITOR) 10 MG tablet TAKE 1 TABLET BY MOUTH ONCE A DAY  30 tablet  10  . ezetimibe (ZETIA) 10 MG tablet Take 10 mg by mouth daily.        Marland Kitchen levothyroxine (SYNTHROID) 50 MCG tablet Take 1 tablet (50 mcg total) by mouth daily.  30 tablet  5  . metoprolol (LOPRESSOR) 100 MG tablet TAKE 1 TABLET BY MOUTH TWO TIMES A DAY  60 tablet  10  . omeprazole (PRILOSEC) 20 MG capsule Take 20 mg by mouth daily.        . quinapril (ACCUPRIL) 20 MG tablet Take 1 tablet (20 mg total) by mouth daily.  30 tablet  11    ROS Negative other than HPI.   PE General Appearance: well developed, well nourished in no acute distress HEENT: symmetrical face, PERRLA, good dentition  Neck: no JVD, thyromegaly, or adenopathy, trachea midline Chest: symmetric without deformity Cardiac: PMI non-displaced, RRR, normal S1, S2, no gallop or murmur Lung: clear to ausculation and percussion Vascular: decreased pulses in the lower extremities  Abdominal: nondistended, nontender, good bowel  sounds, no HSM, no bruits Extremities: no cyanosis, clubbing or edema, no sign of DVT, no varicosities  Skin: normal color, no rashes Neuro: alert and oriented x 3, non-focal Pysch: normal affect Filed Vitals:   03/03/11 1532  BP: 110/40  Pulse: 63  Resp: 14  Height: 5\' 3"  (1.6 m)  Weight: 127 lb (57.607 kg)    EKG  Labs and Studies Reviewed.   Lab Results  Component Value Date   WBC 7.1 07/03/2010   HGB 13.7 07/03/2010   HCT 39.3 07/03/2010   MCV 97.3 07/03/2010   PLT 152 07/03/2010      Chemistry      Component Value Date/Time   NA 139 07/03/2010 0840   K 4.1 07/03/2010 0840   CL 104 07/03/2010 0840   CO2 26 07/03/2010 0840   BUN 20 07/03/2010 0840   CREATININE 1.16 07/03/2010 0840      Component Value Date/Time   CALCIUM 9.2 07/03/2010 0840   ALKPHOS 66 07/03/2010 0840   AST 27 07/03/2010 0840   ALT 22 07/03/2010 0840   BILITOT 0.6 07/03/2010 0840       Lab Results   Component Value Date   CHOL 127 03/25/2009   Lab Results  Component Value Date   HDL 31.90* 03/25/2009   Lab Results  Component Value Date   LDLCALC 67 03/25/2009   Lab Results  Component Value Date   TRIG 139.0 03/25/2009   Lab Results  Component Value Date   CHOLHDL 4 03/25/2009   No results found for this basename: HGBA1C   Lab Results  Component Value Date   ALT 22 07/03/2010   AST 27 07/03/2010   ALKPHOS 66 07/03/2010   BILITOT 0.6 07/03/2010   Lab Results  Component Value Date   TSH 0.97 04/01/2010

## 2011-03-17 ENCOUNTER — Encounter: Payer: Self-pay | Admitting: Internal Medicine

## 2011-03-26 ENCOUNTER — Encounter: Payer: Self-pay | Admitting: Internal Medicine

## 2011-04-09 LAB — DIFFERENTIAL
Basophils Absolute: 0
Basophils Relative: 0
Eosinophils Absolute: 0.1
Eosinophils Relative: 1
Lymphocytes Relative: 12
Lymphs Abs: 1.7
Monocytes Absolute: 0.6
Monocytes Relative: 5
Neutro Abs: 11.3 — ABNORMAL HIGH
Neutrophils Relative %: 82 — ABNORMAL HIGH

## 2011-04-09 LAB — COMPREHENSIVE METABOLIC PANEL
AST: 25
Albumin: 3.7
Alkaline Phosphatase: 56
BUN: 21
CO2: 28
Chloride: 106
GFR calc Af Amer: 54 — ABNORMAL LOW
Potassium: 4.4
Total Bilirubin: 1

## 2011-04-09 LAB — CBC
HCT: 42.3
Hemoglobin: 14.3
MCHC: 33.8
MCV: 96.7
Platelets: 168
RBC: 4.37
RDW: 13.5
WBC: 13.8 — ABNORMAL HIGH

## 2011-04-09 LAB — SAMPLE TO BLOOD BANK

## 2011-04-09 LAB — COMPREHENSIVE METABOLIC PANEL WITH GFR
ALT: 16
Calcium: 9.2
Creatinine, Ser: 1.18
GFR calc non Af Amer: 45 — ABNORMAL LOW
Glucose, Bld: 125 — ABNORMAL HIGH
Sodium: 138
Total Protein: 6.2

## 2011-04-09 LAB — URINALYSIS, ROUTINE W REFLEX MICROSCOPIC
Glucose, UA: NEGATIVE
Hgb urine dipstick: NEGATIVE
Nitrite: NEGATIVE
Protein, ur: NEGATIVE
Specific Gravity, Urine: 1.025
Urobilinogen, UA: 1
pH: 5.5

## 2011-04-09 LAB — SEDIMENTATION RATE: Sed Rate: 3

## 2011-04-09 LAB — LIPASE, BLOOD: Lipase: 18

## 2011-05-11 ENCOUNTER — Ambulatory Visit (INDEPENDENT_AMBULATORY_CARE_PROVIDER_SITE_OTHER): Payer: Medicare Other | Admitting: Urgent Care

## 2011-05-11 ENCOUNTER — Encounter: Payer: Self-pay | Admitting: Urgent Care

## 2011-05-11 DIAGNOSIS — K2270 Barrett's esophagus without dysplasia: Secondary | ICD-10-CM

## 2011-05-11 DIAGNOSIS — K219 Gastro-esophageal reflux disease without esophagitis: Secondary | ICD-10-CM

## 2011-05-11 MED ORDER — OMEPRAZOLE 20 MG PO CPDR: 20.0000 mg | DELAYED_RELEASE_CAPSULE | Freq: Two times a day (BID) | ORAL | Status: DC

## 2011-05-11 NOTE — Assessment & Plan Note (Addendum)
Nocturnal breakthrough. Otherwise doing well.  Increase your omeprazole to 30 minutes before breakfast and 30 minutes before dinner. Call if no better and we will consider repeating EGD early or changing your medication. Next EGD 03/2012 If you decide you would like a screening colonoscopy, please call us. Office visit in September 2013 to set up EGD.

## 2011-05-11 NOTE — Assessment & Plan Note (Signed)
Stable. See GERD

## 2011-05-11 NOTE — Progress Notes (Signed)
Referring Provider: Kirk Ruths, MD Primary Care Physician:  Kirk Ruths, MD Primary Gastroenterologist:  Dr. Jena Gauss   Chief Complaint  Patient presents with  . Follow-up    GERD/Barrett's esophagus    HPI:  [name] is a 77 y.o. female here for follow up for acid reflux & hx Barretts.  She is having some nocturnal breakthrough heartburn. She tells me she was on AcipHex 20 mg daily, however several years ago she was switched to omeprazole 20 mg daily for insurance reasons. She would like something different today. She denies abdominal pain, nausea, vomiting, anorexia or early satiety. She tells me her weight has been stable. Her appetite has been good. She denies any rectal bleeding or melena. She is overdue for screening colonoscopy, however given her age she does not want to consider this at this time.  Past Medical History  Diagnosis Date  . Coronary artery disease 2000    Status post stent placement  . Hyperlipidemia   . Hypertension   . Cardiac tamponade   . Supraventricular tachycardia   . Ascending aortic aneurysm   . Abdominal aortic aneurysm   . Dyspnea on exertion   . GERD (gastroesophageal reflux disease)   . Barrett's esophagus     Past Surgical History  Procedure Date  . Colonoscopy 01/10/2001    Normal  . Egd with biopsy 12/03/2005  . Abdominal aortic aneurysm repair 06/12/2005  . Abdominal aortogram   . Laminectomies     3  . Esophagogastroduodenoscopy 04/10/2009    Dr. Jena Gauss short segment of salmon-colored epithelium consistent with prior short segment Barrett's (biopsy benign)  . Cholecystectomy 07/09/10    Chronic cholelithiasis Dr. Lovell Sheehan    Current Outpatient Prescriptions  Medication Sig Dispense Refill  . acetaminophen (TYLENOL) 325 MG tablet Take 650 mg by mouth every 6 (six) hours as needed.        Marland Kitchen aspirin 81 MG tablet Take 81 mg by mouth daily.        Marland Kitchen atorvastatin (LIPITOR) 10 MG tablet TAKE 1 TABLET BY MOUTH ONCE A DAY  30  tablet  10  . levothyroxine (SYNTHROID) 50 MCG tablet Take 1 tablet (50 mcg total) by mouth daily.  30 tablet  5  . metoprolol (LOPRESSOR) 100 MG tablet TAKE 1 TABLET BY MOUTH TWO TIMES A DAY  60 tablet  10  . omeprazole (PRILOSEC) 20 MG capsule Take 1 capsule (20 mg total) by mouth 2 (two) times daily before a meal.  62 capsule  11  . quinapril (ACCUPRIL) 20 MG tablet Take 1 tablet (20 mg total) by mouth daily.  30 tablet  11  . ZETIA 10 MG tablet TAKE 1 TABLET EVERY DAY  30 tablet  10  . DISCONTD: omeprazole (PRILOSEC) 20 MG capsule Take 20 mg by mouth daily.          Allergies as of 05/11/2011  . (No Known Allergies)    Family History:There is no known family history of colorectal carcinoma , liver disease, or inflammatory bowel disease.  Problem Relation Age of Onset  . Heart disease Mother   . Diabetes Mother   . Heart failure Father     History   Social History  . Marital Status: Married    Spouse Name: N/A    Number of Children: 4  . Years of Education: N/A   Occupational History  . retired; Charity fundraiser    Social History Main Topics  . Smoking status: Former Smoker -- 1.0  packs/day for 40 years    Types: Cigarettes    Quit date: 06/29/1993  . Smokeless tobacco: Not on file  . Alcohol Use: Yes     small glass of wine 3-4 times per yr  . Drug Use: No  . Sexually Active: Not on file  Review of Systems: Gen: Denies any fever, chills, sweats, anorexia, fatigue, weakness, malaise, weight loss, and sleep disorder CV: Denies chest pain, angina, palpitations, syncope, orthopnea, PND, peripheral edema, and claudication. Resp: Denies dyspnea at rest, dyspnea with exercise, cough, sputum, wheezing, coughing up blood, and pleurisy. GI: Denies vomiting blood, jaundice, and fecal incontinence.   Denies dysphagia or odynophagia. Derm: Denies rash, itching, dry skin, hives, moles, warts, or unhealing ulcers.  Psych: Denies depression, anxiety, memory loss, suicidal ideation,  hallucinations, paranoia, and confusion. Heme: Denies bruising, bleeding, and enlarged lymph nodes.  Physical Exam: BP 106/69  Pulse 66  Temp(Src) 97.6 F (36.4 C) (Temporal)  Ht 5\' 3"  (1.6 m)  Wt 127 lb 9.6 oz (57.879 kg)  BMI 22.60 kg/m2 General:   Alert,  Well-developed, well-nourished, pleasant and cooperative in NAD. Head:  Normocephalic and atraumatic. Eyes:  Sclera clear, no icterus.   Conjunctiva pink. Mouth:  No deformity or lesions, oropharynx pink and moist. Neck:  Supple; no masses or thyromegaly. Heart:  Regular rate and rhythm; no murmurs, clicks, rubs,  or gallops. Abdomen:  Soft, nontender and nondistended. No masses, hepatosplenomegaly or hernias noted. Normal bowel sounds, without guarding, and without rebound.   Msk:  Symmetrical without gross deformities. Normal posture. Pulses:  Normal pulses noted. Extremities:  Without clubbing or edema. Neurologic:  Alert and  oriented x4;  grossly normal neurologically. Skin:  Intact without significant lesions or rashes. Cervical Nodes:  No significant cervical adenopathy. Psych:  Alert and cooperative. Normal mood and affect.

## 2011-05-11 NOTE — Progress Notes (Signed)
Cc to PCP 

## 2011-05-11 NOTE — Patient Instructions (Signed)
Increase your omeprazole to 30 minutes before breakfast and 30 minutes before dinner. Call if no better and we will consider repeating EGD early or changing your medication. Next EGD 03/2012 If you decide you would like a screening colonoscopy, please call us. Office visit in September 2013 to set up EGD.

## 2011-10-02 ENCOUNTER — Other Ambulatory Visit: Payer: Self-pay | Admitting: Cardiology

## 2012-01-13 ENCOUNTER — Other Ambulatory Visit: Payer: Self-pay | Admitting: Cardiology

## 2012-01-26 ENCOUNTER — Other Ambulatory Visit: Payer: Self-pay | Admitting: Cardiology

## 2012-02-10 ENCOUNTER — Encounter: Payer: Self-pay | Admitting: Internal Medicine

## 2012-02-23 ENCOUNTER — Encounter: Payer: Self-pay | Admitting: Internal Medicine

## 2012-02-23 ENCOUNTER — Other Ambulatory Visit: Payer: Self-pay | Admitting: Internal Medicine

## 2012-02-23 ENCOUNTER — Ambulatory Visit (INDEPENDENT_AMBULATORY_CARE_PROVIDER_SITE_OTHER): Payer: Medicare Other | Admitting: Internal Medicine

## 2012-02-23 VITALS — BP 117/67 | HR 65 | Temp 98.4°F | Ht 63.0 in | Wt 126.4 lb

## 2012-02-23 DIAGNOSIS — K2270 Barrett's esophagus without dysplasia: Secondary | ICD-10-CM

## 2012-02-23 DIAGNOSIS — K219 Gastro-esophageal reflux disease without esophagitis: Secondary | ICD-10-CM

## 2012-02-23 DIAGNOSIS — Z1211 Encounter for screening for malignant neoplasm of colon: Secondary | ICD-10-CM

## 2012-02-23 MED ORDER — PEG 3350-KCL-NA BICARB-NACL 420 G PO SOLR: 4000.0000 mL | ORAL | Status: AC

## 2012-02-23 NOTE — Patient Instructions (Addendum)
Schedule EGD - Barretts  Schedule TCS - screening  GER information provided

## 2012-02-23 NOTE — Progress Notes (Signed)
Primary Care Physician:  MCGOUGH,WILLIAM M, MD Primary Gastroenterologist:  Dr. Zacary Bauer  Pre-Procedure History & Physical: HPI:  [name] is a 78 y.o. female here for followup of short segment Barrett's soft. Last EGD 2010 demonstrated appearance of short segment Barrett's her biopsies were negative for intestinal metaplasia (positive previously). Has ongoing issues of nocturnal GERD even she takes Prilosec 20 mg orally twice daily. No dysphagia. No melena. AcipHex previously worked much better than twice a day Prilosec however, her insurance would not pay for that medication. It's been every 10 years and she had a colonoscopy for screening purposes. No lower GI tract symptoms. No family history of colon polyps or colon cancer. She had a negative colonoscopy in 2002  Past Medical History  Diagnosis Date  . Coronary artery disease 2000    Status post stent placement  . Hyperlipidemia   . Hypertension   . Cardiac tamponade   . Supraventricular tachycardia   . Ascending aortic aneurysm   . Abdominal aortic aneurysm   . Dyspnea on exertion   . GERD (gastroesophageal reflux disease)   . Barrett's esophagus     Past Surgical History  Procedure Date  . Colonoscopy 01/10/2001    Normal  . Egd with biopsy 12/03/2005  . Abdominal aortic aneurysm repair 06/12/2005  . Abdominal aortogram   . Laminectomies     3  . Esophagogastroduodenoscopy 04/10/2009    Dr. Anselmo Reihl-> short segment of salmon-colored epithelium consistent with prior short segment Barrett's (biopsy benign)  . Cholecystectomy 07/09/10    Chronic cholelithiasis Dr. Jenkins    Prior to Admission medications   Medication Sig Start Date End Date Taking? Authorizing Provider  acetaminophen (TYLENOL) 325 MG tablet Take 650 mg by mouth every 6 (six) hours as needed.     Yes Historical Provider, MD  aspirin 81 MG tablet Take 81 mg by mouth daily.     Yes Historical Provider, MD  atorvastatin (LIPITOR) 10 MG tablet TAKE 1 TABLET BY  MOUTH ONCE A DAY 10/02/11  Yes Thomas C Wall, MD  levothyroxine (SYNTHROID, LEVOTHROID) 50 MCG tablet Take 50 mcg by mouth daily.   Yes Thomas C Wall, MD  metoprolol (LOPRESSOR) 100 MG tablet TAKE 1 TABLET BY MOUTH TWO TIMES A DAY 01/26/12  Yes Thomas C Wall, MD  omeprazole (PRILOSEC) 20 MG capsule Take 1 capsule (20 mg total) by mouth 2 (two) times daily before a meal. 05/11/11  Yes Kandice L Jones, NP  quinapril (ACCUPRIL) 20 MG tablet TAKE 1 TABLET EVERY DAY 01/13/12  Yes Thomas C Wall, MD  ZETIA 10 MG tablet TAKE 1 TABLET EVERY DAY 01/26/12  Yes Thomas C Wall, MD    Allergies as of 02/23/2012  . (No Known Allergies)    Family History  Problem Relation Age of Onset  . Heart disease Mother   . Diabetes Mother   . Heart failure Father     History   Social History  . Marital Status: Married    Spouse Name: N/A    Number of Children: 4  . Years of Education: N/A   Occupational History  . retired; RN    Social History Main Topics  . Smoking status: Former Smoker -- 1.0 packs/day for 40 years    Types: Cigarettes    Quit date: 06/29/1993  . Smokeless tobacco: Not on file  . Alcohol Use: Yes     small glass of wine 3-4 times per yr  . Drug Use: No  . Sexually   Active: Not on file   Other Topics Concern  . Not on file   Social History Narrative  . No narrative on file    Review of Systems: See HPI, otherwise negative ROS  Physical Exam: BP 117/67  Pulse 65  Temp 98.4 F (36.9 C) (Temporal)  Ht 5' 3" (1.6 m)  Wt 126 lb 6.4 oz (57.335 kg)  BMI 22.39 kg/m2 General:   Alert,  Well-developed, well-nourished, pleasant and cooperative in NAD Skin:  Intact without significant lesions or rashes. Eyes:  Sclera clear, no icterus.   Conjunctiva pink. Ears:  Normal auditory acuity. Nose:  No deformity, discharge,  or lesions. Mouth:  No deformity or lesions. Neck:  Supple; no masses or thyromegaly. No significant cervical adenopathy. Lungs:  Clear throughout to auscultation.    No wheezes, crackles, or rhonchi. No acute distress. Heart:  Regular rate and rhythm; no murmurs, clicks, rubs,  or gallops. Abdomen: Non-distended, normal bowel sounds.  Soft and nontender without appreciable mass or hepatosplenomegaly.  Pulses:  Normal pulses noted. Extremities:  Without clubbing or edema.  Impression/Plan:  History short segment Barrett's esophagus. GERD symptoms poorly controlled at night. Due for surveillance EGD this time. Overdue for a screening colonoscopy-average risk. I've offered patient both a surveillance EGD a screening colonoscopy in the near future.The risks, benefits, limitations, imponderables and alternatives regarding both EGD and colonoscopy have been reviewed with the patient. Questions have been answered. All parties agreeable.  

## 2012-03-07 ENCOUNTER — Encounter: Payer: Self-pay | Admitting: Cardiology

## 2012-03-07 ENCOUNTER — Encounter (HOSPITAL_COMMUNITY): Payer: Self-pay | Admitting: Pharmacy Technician

## 2012-03-07 ENCOUNTER — Ambulatory Visit (INDEPENDENT_AMBULATORY_CARE_PROVIDER_SITE_OTHER): Payer: Medicare Other | Admitting: Cardiology

## 2012-03-07 VITALS — BP 118/80 | HR 57 | Ht 63.0 in | Wt 125.0 lb

## 2012-03-07 DIAGNOSIS — R0609 Other forms of dyspnea: Secondary | ICD-10-CM

## 2012-03-07 DIAGNOSIS — Z8679 Personal history of other diseases of the circulatory system: Secondary | ICD-10-CM

## 2012-03-07 DIAGNOSIS — E785 Hyperlipidemia, unspecified: Secondary | ICD-10-CM

## 2012-03-07 DIAGNOSIS — I10 Essential (primary) hypertension: Secondary | ICD-10-CM

## 2012-03-07 DIAGNOSIS — I2510 Atherosclerotic heart disease of native coronary artery without angina pectoris: Secondary | ICD-10-CM

## 2012-03-07 LAB — COMPREHENSIVE METABOLIC PANEL
ALT: 16 U/L (ref 0–35)
BUN: 16 mg/dL (ref 6–23)
CO2: 25 mEq/L (ref 19–32)
Calcium: 9.2 mg/dL (ref 8.4–10.5)
Chloride: 104 mEq/L (ref 96–112)
Creatinine, Ser: 1 mg/dL (ref 0.4–1.2)
GFR: 59.33 mL/min — ABNORMAL LOW (ref >60.00)
Glucose, Bld: 96 mg/dL (ref 70–99)

## 2012-03-07 LAB — LIPID PANEL
Cholesterol: 127 mg/dL (ref 0–200)
Total CHOL/HDL Ratio: 3
Triglycerides: 143 mg/dL (ref 0.0–149.0)

## 2012-03-07 NOTE — Assessment & Plan Note (Signed)
We'll stop her Zetia and continue low-dose Lipitor. Check lipids today and she is way overdue. We'll check LFTs as well.

## 2012-03-07 NOTE — Assessment & Plan Note (Signed)
Under good control. No change in treatment. 

## 2012-03-07 NOTE — Progress Notes (Signed)
HPI [name] comes in today for her history of coronary artery disease, history of ascending aortic aneurysm leak with pericardial tamponade, hypertension, history of SVT, and history of hyperlipidemia.  She doing remarkably well. She denies any chest pain or angina. She does have generalized fatigue and wonders if his Lipitor. I do not have blood work from last year the last get it. We'll obtain today.  She denies claudication or symptoms of TIAs or mini strokes. She seems to be compliant with medications.  Past Medical History  Diagnosis Date  . Coronary artery disease 2000    Status post stent placement  . Hyperlipidemia   . Hypertension   . Cardiac tamponade   . Supraventricular tachycardia   . Ascending aortic aneurysm   . Abdominal aortic aneurysm   . Dyspnea on exertion   . GERD (gastroesophageal reflux disease)   . Barrett's esophagus     Current Outpatient Prescriptions  Medication Sig Dispense Refill  . acetaminophen (TYLENOL) 325 MG tablet Take 650 mg by mouth every 6 (six) hours as needed.        Marland Kitchen aspirin 81 MG tablet Take 81 mg by mouth daily.        Marland Kitchen atorvastatin (LIPITOR) 10 MG tablet TAKE 1 TABLET BY MOUTH ONCE A DAY  30 tablet  4  . levothyroxine (SYNTHROID, LEVOTHROID) 50 MCG tablet Take 50 mcg by mouth daily.      . metoprolol (LOPRESSOR) 100 MG tablet TAKE 1 TABLET BY MOUTH TWO TIMES A DAY  60 tablet  5  . omeprazole (PRILOSEC) 20 MG capsule Take 1 capsule (20 mg total) by mouth 2 (two) times daily before a meal.  62 capsule  11  . quinapril (ACCUPRIL) 20 MG tablet TAKE 1 TABLET EVERY DAY  30 tablet  5    Not on File  Family History  Problem Relation Age of Onset  . Heart disease Mother   . Diabetes Mother   . Heart failure Father     History   Social History  . Marital Status: Married    Spouse Name: N/A    Number of Children: 4  . Years of Education: N/A   Occupational History  . retired; Charity fundraiser    Social History Main Topics  . Smoking  status: Former Smoker -- 1.0 packs/day for 40 years    Types: Cigarettes    Quit date: 06/29/1993  . Smokeless tobacco: Not on file  . Alcohol Use: Yes     small glass of wine 3-4 times per yr  . Drug Use: No  . Sexually Active: Not on file   Other Topics Concern  . Not on file   Social History Narrative  . No narrative on file    ROS ALL NEGATIVE EXCEPT THOSE NOTED IN HPI  PE  General Appearance: well developed, well nourished in no acute distress HEENT: symmetrical face, PERRLA,  Neck: no JVD, thyromegaly, or adenopathy, trachea midline Chest: symmetric without deformity Cardiac: PMI non-displaced, RRR, normal S1, S2, no gallop or murmur Lung: clear to ausculation and percussion Vascular: all pulses full without bruits  Abdominal: nondistended, nontender, good bowel sounds, no HSM, no bruits Extremities: no cyanosis, clubbing or edema, no sign of DVT, no varicosities  Skin: normal color, no rashes Neuro: alert and oriented x 3, non-focal Pysch: normal affect  EKG Sinus bradycardia with PACs, otherwise normal EKG BMET    Component Value Date/Time   NA 139 07/03/2010 0840   K 4.1 07/03/2010  0840   CL 104 07/03/2010 0840   CO2 26 07/03/2010 0840   GLUCOSE 67* 07/03/2010 0840   BUN 20 07/03/2010 0840   CREATININE 1.16 07/03/2010 0840   CALCIUM 9.2 07/03/2010 0840   GFRNONAA 45* 07/03/2010 0840   GFRAA  Value: 55        The eGFR has been calculated using the MDRD equation. This calculation has not been validated in all clinical situations. eGFR's persistently <60 mL/min signify possible Chronic Kidney Disease.* 07/03/2010 0840    Lipid Panel     Component Value Date/Time   CHOL 127 03/25/2009 0000   TRIG 139.0 03/25/2009 0000   HDL 31.90* 03/25/2009 0000   CHOLHDL 4 03/25/2009 0000   VLDL 27.8 03/25/2009 0000   LDLCALC 67 03/25/2009 0000    CBC    Component Value Date/Time   WBC 7.1 07/03/2010 0840   RBC 4.04 07/03/2010 0840   HGB 13.7 07/03/2010 0840   HCT 39.3 07/03/2010 0840   PLT  152 07/03/2010 0840   MCV 97.3 07/03/2010 0840   MCH 33.9 07/03/2010 0840   MCHC 34.9 07/03/2010 0840   RDW 13.0 07/03/2010 0840   LYMPHSABS 1.7 03/26/2007 1805   MONOABS 0.6 03/26/2007 1805   EOSABS 0.1 03/26/2007 1805   BASOSABS 0.0 03/26/2007 1805

## 2012-03-07 NOTE — Patient Instructions (Addendum)
Lab work today Will call you with results.  STOP Zetia  Your physician wants you to follow-up in:12 months You will receive a reminder letter in the mail two months in advance. If you don't receive a letter, please call our office to schedule the follow-up appointment.

## 2012-03-07 NOTE — Assessment & Plan Note (Signed)
No recurrence. 

## 2012-03-07 NOTE — Assessment & Plan Note (Signed)
Stable. Continue secondary preventative therapy. 

## 2012-03-14 ENCOUNTER — Telehealth: Payer: Self-pay | Admitting: *Deleted

## 2012-03-14 ENCOUNTER — Other Ambulatory Visit: Payer: Self-pay | Admitting: *Deleted

## 2012-03-14 ENCOUNTER — Other Ambulatory Visit: Payer: Self-pay | Admitting: Cardiology

## 2012-03-14 DIAGNOSIS — E039 Hypothyroidism, unspecified: Secondary | ICD-10-CM

## 2012-03-14 MED ORDER — LEVOTHYROXINE SODIUM 75 MCG PO TABS: 75.0000 ug | ORAL_TABLET | Freq: Every day | ORAL | Status: DC

## 2012-03-14 MED ORDER — ATORVASTATIN CALCIUM 10 MG PO TABS: 10.0000 mg | ORAL_TABLET | Freq: Every day | ORAL | Status: DC

## 2012-03-14 NOTE — Telephone Encounter (Signed)
Calling patient to verify Lipitor dosage. Ms [name] is taking 10mg  not 40mg  when taking Lipitor. She states she has recently put taking Lipitor on hold per Dr Daleen Squibb to see if she feels better. This has been since last visit (03/07/12) and she says they discussed to this for about 2 weeks. She states she will not take 40mg . Rx has been sent in for Atorvastatin (lipitor) 10mg  once daily by mouth with 3 refills.    Ceniya Fowers, CMA

## 2012-03-21 ENCOUNTER — Encounter (HOSPITAL_COMMUNITY): Payer: Self-pay | Admitting: *Deleted

## 2012-03-21 ENCOUNTER — Encounter (HOSPITAL_COMMUNITY)
Admission: RE | Disposition: A | Discharge status: Home / Self Care | Payer: Self-pay | Source: Ambulatory Visit | Attending: Internal Medicine

## 2012-03-21 ENCOUNTER — Ambulatory Visit (HOSPITAL_COMMUNITY)
Admission: RE | Admit: 2012-03-21 | Discharge: 2012-03-21 | Disposition: A | Discharge status: Home / Self Care | Payer: Medicare Other | Source: Ambulatory Visit | Attending: Internal Medicine | Admitting: Internal Medicine

## 2012-03-21 ENCOUNTER — Other Ambulatory Visit: Payer: Self-pay | Admitting: Internal Medicine

## 2012-03-21 DIAGNOSIS — K225 Diverticulum of esophagus, acquired: Secondary | ICD-10-CM

## 2012-03-21 DIAGNOSIS — D128 Benign neoplasm of rectum: Secondary | ICD-10-CM | POA: Insufficient documentation

## 2012-03-21 DIAGNOSIS — I10 Essential (primary) hypertension: Secondary | ICD-10-CM | POA: Insufficient documentation

## 2012-03-21 DIAGNOSIS — Z1211 Encounter for screening for malignant neoplasm of colon: Secondary | ICD-10-CM

## 2012-03-21 DIAGNOSIS — K2270 Barrett's esophagus without dysplasia: Secondary | ICD-10-CM

## 2012-03-21 DIAGNOSIS — K228 Other specified diseases of esophagus: Secondary | ICD-10-CM

## 2012-03-21 DIAGNOSIS — E785 Hyperlipidemia, unspecified: Secondary | ICD-10-CM | POA: Insufficient documentation

## 2012-03-21 DIAGNOSIS — K2940 Chronic atrophic gastritis without bleeding: Secondary | ICD-10-CM | POA: Insufficient documentation

## 2012-03-21 DIAGNOSIS — K219 Gastro-esophageal reflux disease without esophagitis: Secondary | ICD-10-CM

## 2012-03-21 DIAGNOSIS — D129 Benign neoplasm of anus and anal canal: Secondary | ICD-10-CM

## 2012-03-21 SURGERY — COLONOSCOPY WITH ESOPHAGOGASTRODUODENOSCOPY (EGD)
Anesthesia: Moderate Sedation

## 2012-03-21 MED ORDER — MEPERIDINE HCL 100 MG/ML IJ SOLN
INTRAMUSCULAR | Status: AC
  Filled 2012-03-21: qty 2

## 2012-03-21 MED ORDER — MEPERIDINE HCL 100 MG/ML IJ SOLN
INTRAMUSCULAR | Status: DC | PRN
  Administered 2012-03-21: 50 mg via INTRAVENOUS

## 2012-03-21 MED ORDER — STERILE WATER FOR IRRIGATION IR SOLN
Status: DC | PRN
  Administered 2012-03-21: 12:00:00

## 2012-03-21 MED ORDER — BUTAMBEN-TETRACAINE-BENZOCAINE 2-2-14 % EX AERO
INHALATION_SPRAY | CUTANEOUS | Status: DC | PRN
  Administered 2012-03-21: 2 via TOPICAL

## 2012-03-21 MED ORDER — MIDAZOLAM HCL 5 MG/5ML IJ SOLN
INTRAMUSCULAR | Status: DC | PRN
  Administered 2012-03-21: 2 mg via INTRAVENOUS
  Administered 2012-03-21: 1 mg via INTRAVENOUS

## 2012-03-21 MED ORDER — SODIUM CHLORIDE 0.45 % IV SOLN
INTRAVENOUS | Status: DC
  Administered 2012-03-21: 11:00:00 via INTRAVENOUS

## 2012-03-21 MED ORDER — MIDAZOLAM HCL 5 MG/5ML IJ SOLN
INTRAMUSCULAR | Status: AC
  Filled 2012-03-21: qty 10

## 2012-03-21 NOTE — Discharge Instructions (Addendum)
EGD Discharge instructions Please read the instructions outlined below and refer to this sheet in the next few weeks. These discharge instructions provide you with general information on caring for yourself after you leave the hospital. Your doctor may also give you specific instructions. While your treatment has been planned according to the most current medical practices available, unavoidable complications occasionally occur. If you have any problems or questions after discharge, please call your doctor. ACTIVITY  You may resume your regular activity but move at a slower pace for the next 24 hours.   Take frequent rest periods for the next 24 hours.   Walking will help expel (get rid of) the air and reduce the bloated feeling in your abdomen.   No driving for 24 hours (because of the anesthesia (medicine) used during the test).   You may shower.   Do not sign any important legal documents or operate any machinery for 24 hours (because of the anesthesia used during the test).  NUTRITION  Drink plenty of fluids.   You may resume your normal diet.   Begin with a light meal and progress to your normal diet.   Avoid alcoholic beverages for 24 hours or as instructed by your caregiver.  MEDICATIONS  You may resume your normal medications unless your caregiver tells you otherwise.  WHAT YOU CAN EXPECT TODAY  You may experience abdominal discomfort such as a feeling of fullness or gas pains.  FOLLOW-UP  Your doctor will discuss the results of your test with you.  SEEK IMMEDIATE MEDICAL ATTENTION IF ANY OF THE FOLLOWING OCCUR:  Excessive nausea (feeling sick to your stomach) and/or vomiting.   Severe abdominal pain and distention (swelling).   Trouble swallowing.   Temperature over 101 F (37.8 C).   Rectal bleeding or vomiting of blood.   Colonoscopy Discharge Instructions  Read the instructions outlined below and refer to this sheet in the next few weeks. These  discharge instructions provide you with general information on caring for yourself after you leave the hospital. Your doctor may also give you specific instructions. While your treatment has been planned according to the most current medical practices available, unavoidable complications occasionally occur. If you have any problems or questions after discharge, call Dr. Gala Romney at [phone]. ACTIVITY  You may resume your regular activity, but move at a slower pace for the next 24 hours.   Take frequent rest periods for the next 24 hours.   Walking will help get rid of the air and reduce the bloated feeling in your belly (abdomen).   No driving for 24 hours (because of the medicine (anesthesia) used during the test).    Do not sign any important legal documents or operate any machinery for 24 hours (because of the anesthesia used during the test).  NUTRITION  Drink plenty of fluids.   You may resume your normal diet as instructed by your doctor.   Begin with a light meal and progress to your normal diet. Heavy or fried foods are harder to digest and may make you feel sick to your stomach (nauseated).   Avoid alcoholic beverages for 24 hours or as instructed.  MEDICATIONS  You may resume your normal medications unless your doctor tells you otherwise.  WHAT YOU CAN EXPECT TODAY  Some feelings of bloating in the abdomen.   Passage of more gas than usual.   Spotting of blood in your stool or on the toilet paper.  IF YOU HAD POLYPS REMOVED DURING THE COLONOSCOPY:  No aspirin products for 7 days or as instructed.   No alcohol for 7 days or as instructed.   Eat a soft diet for the next 24 hours.  FINDING OUT THE RESULTS OF YOUR TEST Not all test results are available during your visit. If your test results are not back during the visit, make an appointment with your caregiver to find out the results. Do not assume everything is normal if you have not heard from your caregiver or the  medical facility. It is important for you to follow up on all of your test results.  SEEK IMMEDIATE MEDICAL ATTENTION IF:  You have more than a spotting of blood in your stool.   Your belly is swollen (abdominal distention).   You are nauseated or vomiting.   You have a temperature over 101.   You have abdominal pain or discomfort that is severe or gets worse throughout the day.   Information on Zenker's diverticulum divided.  GERD information provided.  Polyp information provided.  Stop Prilosec; begin Dexilant 60 mg daily. Prescription provided. Go by my office for free samples as well.  Barium pill esophagram to further evaluate Zenker's diverticulum later this week or next week.  Further recommendations to follow pending review of pathology report  Colon Polyps A polyp is extra tissue that grows inside your body. Colon polyps grow in the large intestine. The large intestine, also called the colon, is part of your digestive system. It is a long, hollow tube at the end of your digestive tract where your body makes and stores stool. Most polyps are not dangerous. They are benign. This means they are not cancerous. But over time, some types of polyps can turn into cancer. Polyps that are smaller than a pea are usually not harmful. But larger polyps could someday become or may already be cancerous. To be safe, doctors remove all polyps and test them.  WHO GETS POLYPS? Anyone can get polyps, but certain people are more likely than others. You may have a greater chance of getting polyps if:  You are over 50.   You have had polyps before.   Someone in your family has had polyps.   Someone in your family has had cancer of the large intestine.   Find out if someone in your family has had polyps. You may also be more likely to get polyps if you:   Eat a lot of fatty foods.   Smoke.   Drink alcohol.   Do not exercise.   Eat too much.  SYMPTOMS  Most small polyps do not  cause symptoms. People often do not know they have one until their caregiver finds it during a regular checkup or while testing them for something else. Some people do have symptoms like these:  Bleeding from the anus. You might notice blood on your underwear or on toilet paper after you have had a bowel movement.   Constipation or diarrhea that lasts more than a week.   Blood in the stool. Blood can make stool look black or it can show up as red streaks in the stool.  If you have any of these symptoms, see your caregiver. HOW DOES THE DOCTOR TEST FOR POLYPS? The doctor can use four tests to check for polyps:  Digital rectal exam. The caregiver wears gloves and checks your rectum (the last part of the large intestine) to see if it feels normal. This test would find polyps only in the rectum. Your caregiver may  need to do one of the other tests listed below to find polyps higher up in the intestine.   Barium enema. The caregiver puts a liquid called barium into your rectum before taking x-rays of your large intestine. Barium makes your intestine look white in the pictures. Polyps are dark, so they are easy to see.   Sigmoidoscopy. With this test, the caregiver can see inside your large intestine. A thin flexible tube is placed into your rectum. The device is called a sigmoidoscope, which has a light and a tiny video camera in it. The caregiver uses the sigmoidoscope to look at the last third of your large intestine.   Colonoscopy. This test is like sigmoidoscopy, but the caregiver looks at all of the large intestine. It usually requires sedation. This is the most common method for finding and removing polyps.  TREATMENT   The caregiver will remove the polyp during sigmoidoscopy or colonoscopy. The polyp is then tested for cancer.   If you have had polyps, your caregiver may want you to get tested regularly in the future.  PREVENTION  There is not one sure way to prevent polyps. You might be  able to lower your risk of getting them if you:  Eat more fruits and vegetables and less fatty food.   Do not smoke.   Avoid alcohol.   Exercise every day.   Lose weight if you are overweight.   Eating more calcium and folate can also lower your risk of getting polyps. Some foods that are rich in calcium are milk, cheese, and broccoli. Some foods that are rich in folate are chickpeas, kidney beans, and spinach.   Aspirin might help prevent polyps. Studies are under way.  Document Released: 03/11/2004 Document Revised: 06/04/2011 Document Reviewed: 08/17/2007 [facility] Patient Information 2012 [place], [place].Gastroesophageal Reflux Disease, Adult Gastroesophageal reflux disease (GERD) happens when acid from your stomach flows up into the esophagus. When acid comes in contact with the esophagus, the acid causes soreness (inflammation) in the esophagus. Over time, GERD may create small holes (ulcers) in the lining of the esophagus. CAUSES   Increased body weight. This puts pressure on the stomach, making acid rise from the stomach into the esophagus.   Smoking. This increases acid production in the stomach.   Drinking alcohol. This causes decreased pressure in the lower esophageal sphincter (valve or ring of muscle between the esophagus and stomach), allowing acid from the stomach into the esophagus.   Late evening meals and a full stomach. This increases pressure and acid production in the stomach.   A malformed lower esophageal sphincter.  Sometimes, no cause is found. SYMPTOMS   Burning pain in the lower part of the mid-chest behind the breastbone and in the mid-stomach area. This may occur twice a week or more often.   Trouble swallowing.   Sore throat.   Dry cough.   Asthma-like symptoms including chest tightness, shortness of breath, or wheezing.  DIAGNOSIS  Your caregiver may be able to diagnose GERD based on your symptoms. In some cases, X-rays and other tests may be  done to check for complications or to check the condition of your stomach and esophagus. TREATMENT  Your caregiver may recommend over-the-counter or prescription medicines to help decrease acid production. Ask your caregiver before starting or adding any new medicines.  HOME CARE INSTRUCTIONS   Change the factors that you can control. Ask your caregiver for guidance concerning weight loss, quitting smoking, and alcohol consumption.   Avoid foods and  drinks that make your symptoms worse, such as:   Caffeine or alcoholic drinks.   Chocolate.   Peppermint or mint flavorings.   Garlic and onions.   Spicy foods.   Citrus fruits, such as oranges, lemons, or limes.   Tomato-based foods such as sauce, chili, salsa, and pizza.   Fried and fatty foods.   Avoid lying down for the 3 hours prior to your bedtime or prior to taking a nap.   Eat small, frequent meals instead of large meals.   Wear loose-fitting clothing. Do not wear anything tight around your waist that causes pressure on your stomach.   Raise the head of your bed 6 to 8 inches with wood blocks to help you sleep. Extra pillows will not help.   Only take over-the-counter or prescription medicines for pain, discomfort, or fever as directed by your caregiver.   Do not take aspirin, ibuprofen, or other nonsteroidal anti-inflammatory drugs (NSAIDs).  SEEK IMMEDIATE MEDICAL CARE IF:   You have pain in your arms, neck, jaw, teeth, or back.   Your pain increases or changes in intensity or duration.   You develop nausea, vomiting, or sweating (diaphoresis).   You develop shortness of breath, or you faint.   Your vomit is green, yellow, black, or looks like coffee grounds or blood.   Your stool is red, bloody, or black.  These symptoms could be signs of other problems, such as heart disease, gastric bleeding, or esophageal bleeding. MAKE SURE YOU:   Understand these instructions.   Will watch your condition.   Will get  help right away if you are not doing well or get worse.  Document Released: 03/25/2005 Document Revised: 06/04/2011 Document Reviewed: 01/02/2011 [facility] Patient Information 2012 [place], [place].

## 2012-03-21 NOTE — Op Note (Signed)
[facility] [address] [place], [address]   COLONOSCOPY PROCEDURE REPORT  PATIENT: [name], [name]  MR#:         [id number] BIRTHDATE: Aug 14, 1928 , 82  yrs. old GENDER: Female ENDOSCOPIST: R.  Roetta Sessions, MD FACP FACG REFERRED BY:  Karleen Hampshire, M.D. PROCEDURE DATE:  03/21/2012 PROCEDURE:     colonoscopy with biopsy  INDICATIONS: colorectal cancer screening  INFORMED CONSENT:  The risks, benefits, alternatives and imponderables including but not limited to bleeding, perforation as well as the possibility of a missed lesion have been reviewed.  The potential for biopsy, lesion removal, etc. have also been discussed.  Questions have been answered.  All parties agreeable. Please see the history and physical in the medical record for more information.  MEDICATIONS: Versed 3 mg IV and Demerol 50 mg IV in divided doses.  DESCRIPTION OF PROCEDURE:  After a digital rectal exam was performed, the EC-[id number] ([id number])  colonoscope was advanced from the anus through the rectum and colon to the area of the cecum, ileocecal valve and appendiceal orifice.  The cecum was deeply intubated.  These structures were well-seen and photographed for the record.  From the level of the cecum and ileocecal valve, the scope was slowly and cautiously withdrawn.  The mucosal surfaces were carefully surveyed utilizing scope tip deflection to facilitate fold flattening as needed.  The scope was pulled down into the rectum where a thorough examination including retroflexion was performed.    FINDINGS:  suboptimal preparation. Single diminutive polyp in the distal rectum at 3 cm in from the anal verge; otherwise, normal rectum. Somewhat tortuous but otherwise normal appearing colon.  THERAPEUTIC / DIAGNOSTIC MANEUVERS PERFORMED:  the rectal polyps cold biopsied/removed  COMPLICATIONS: none  CECAL WITHDRAWAL TIME:  8 minutes  IMPRESSION:  rectal polyp-removed as described  above.  RECOMMENDATIONS: followup on pathology   _______________________________ eSigned:  R. Roetta Sessions, MD FACP [facility] 03/21/2012 12:52 PM   CC:

## 2012-03-21 NOTE — Op Note (Signed)
[facility] [address] [place], [address]   ENDOSCOPY PROCEDURE REPORT  PATIENT: [name], [name]  MR#: [id number] BIRTHDATE: 06/15/1929 , 82  yrs. old GENDER: Female ENDOSCOPIST: R.  Roetta Sessions, MD FACP FACG REFERRED BY:  Karleen Hampshire, M.D. PROCEDURE DATE:  03/21/2012 PROCEDURE:     EGD with esophageal biopsy  INDICATIONS:     refractory GERD; history short segment Barrett's esophagus. No dysphagia. Coughing after a meal.  INFORMED CONSENT:   The risks, benefits, limitations, alternatives and imponderables have been discussed.  The potential for biopsy, esophogeal dilation, etc. have also been reviewed.  Questions have been answered.  All parties agreeable.  Please see the history and physical in the medical record for more information.  MEDICATIONS:   Versed 2 mg IV and Demerol 50 mg IV in divided doses. Cetacaine spray.  DESCRIPTION OF PROCEDURE:   The EG-[id number] ([id number])  endoscope was introduced through the mouth and advanced to the second portion of the duodenum without difficulty or limitations.  The mucosal surfaces were surveyed very carefully during advancement of the scope and upon withdrawal.  Retroflexion view of the proximal stomach and esophagogastric junction was performed.      FINDINGS: pocket in the posterior pharyngeal constrictor muscles consistent with a Zenker's diverticulum. The upper esophageal sphincter and inlet into  the esophagus displaced at this level.patulous EG junction. Squamocolumnar junction coming about 2 cm above the EG junction. There was a focal area of esophagitis. There was no mass. Stomach empty. Small hiatal hernia; otherwise, normal stomach. Pylorus patent. Normal D1 and D2.  THERAPEUTIC / DIAGNOSTIC MANEUVERS PERFORMED:  biopsies of abnormal-appearing distal esophagus taken for pathology   COMPLICATIONS:  None  IMPRESSION: Abnormal distal esophageal mucosa concerning for Barrett's  (status post  biopsy) as documented previously. Focal area of erosion consistent with mild erosive reflux esophagitis. Small hiatal hernia. Zenker's diverticulum  RECOMMENDATIONS:  Followup on pathology. Stop Prilosec; begin Dexilant 60 mg orally daily. See colonoscopy report. Barium pill esophagram on another day to further evaluate the  Zenker's and to establish a baseline.    _______________________________ R. Roetta Sessions, MD FACP [facility] eSigned:  R. Roetta Sessions, MD FACP [facility] 03/21/2012 12:30 PM     CC:

## 2012-03-21 NOTE — Interval H&P Note (Signed)
History and Physical Interval Note:  03/21/2012 12:05 PM  [name]  has presented today for surgery, with the diagnosis of Screening, Barretts, GERD  The various methods of treatment have been discussed with the patient and family. After consideration of risks, benefits and other options for treatment, the patient has consented to  Procedure(s) (LRB) with comments: COLONOSCOPY WITH ESOPHAGOGASTRODUODENOSCOPY (EGD) (N/A) - 12:00 as a surgical intervention .  The patient's history has been reviewed, patient examined, no change in status, stable for surgery.  I have reviewed the patient's chart and labs.  Questions were answered to the patient's satisfaction.     Eula Listen

## 2012-03-21 NOTE — H&P (View-Only) (Signed)
Primary Care Physician:  Kirk Ruths, MD Primary Gastroenterologist:  Dr. Jena Gauss  Pre-Procedure History & Physical: HPI:  [name] is a 78 y.o. female here for followup of short segment Barrett's soft. Last EGD 2010 demonstrated appearance of short segment Barrett's her biopsies were negative for intestinal metaplasia (positive previously). Has ongoing issues of nocturnal GERD even she takes Prilosec 20 mg orally twice daily. No dysphagia. No melena. AcipHex previously worked much better than twice a day Prilosec however, her insurance would not pay for that medication. It's been every 10 years and she had a colonoscopy for screening purposes. No lower GI tract symptoms. No family history of colon polyps or colon cancer. She had a negative colonoscopy in 2002  Past Medical History  Diagnosis Date  . Coronary artery disease 2000    Status post stent placement  . Hyperlipidemia   . Hypertension   . Cardiac tamponade   . Supraventricular tachycardia   . Ascending aortic aneurysm   . Abdominal aortic aneurysm   . Dyspnea on exertion   . GERD (gastroesophageal reflux disease)   . Barrett's esophagus     Past Surgical History  Procedure Date  . Colonoscopy 01/10/2001    Normal  . Egd with biopsy 12/03/2005  . Abdominal aortic aneurysm repair 06/12/2005  . Abdominal aortogram   . Laminectomies     3  . Esophagogastroduodenoscopy 04/10/2009    Dr. Jena Gauss short segment of salmon-colored epithelium consistent with prior short segment Barrett's (biopsy benign)  . Cholecystectomy 07/09/10    Chronic cholelithiasis Dr. Lovell Sheehan    Prior to Admission medications   Medication Sig Start Date End Date Taking? Authorizing Provider  acetaminophen (TYLENOL) 325 MG tablet Take 650 mg by mouth every 6 (six) hours as needed.     Yes Historical Provider, MD  aspirin 81 MG tablet Take 81 mg by mouth daily.     Yes Historical Provider, MD  atorvastatin (LIPITOR) 10 MG tablet TAKE 1 TABLET BY  MOUTH ONCE A DAY 10/02/11  Yes Gaylord Shih, MD  levothyroxine (SYNTHROID, LEVOTHROID) 50 MCG tablet Take 50 mcg by mouth daily.   Yes Gaylord Shih, MD  metoprolol (LOPRESSOR) 100 MG tablet TAKE 1 TABLET BY MOUTH TWO TIMES A DAY 01/26/12  Yes Gaylord Shih, MD  omeprazole (PRILOSEC) 20 MG capsule Take 1 capsule (20 mg total) by mouth 2 (two) times daily before a meal. 05/11/11  Yes Joselyn Arrow, NP  quinapril (ACCUPRIL) 20 MG tablet TAKE 1 TABLET EVERY DAY 01/13/12  Yes Gaylord Shih, MD  ZETIA 10 MG tablet TAKE 1 TABLET EVERY DAY 01/26/12  Yes Gaylord Shih, MD    Allergies as of 02/23/2012  . (No Known Allergies)    Family History  Problem Relation Age of Onset  . Heart disease Mother   . Diabetes Mother   . Heart failure Father     History   Social History  . Marital Status: Married    Spouse Name: N/A    Number of Children: 4  . Years of Education: N/A   Occupational History  . retired; Charity fundraiser    Social History Main Topics  . Smoking status: Former Smoker -- 1.0 packs/day for 40 years    Types: Cigarettes    Quit date: 06/29/1993  . Smokeless tobacco: Not on file  . Alcohol Use: Yes     small glass of wine 3-4 times per yr  . Drug Use: No  . Sexually  Active: Not on file   Other Topics Concern  . Not on file   Social History Narrative  . No narrative on file    Review of Systems: See HPI, otherwise negative ROS  Physical Exam: BP 117/67  Pulse 65  Temp 98.4 F (36.9 C) (Temporal)  Ht 5\' 3"  (1.6 m)  Wt 126 lb 6.4 oz (57.335 kg)  BMI 22.39 kg/m2 General:   Alert,  Well-developed, well-nourished, pleasant and cooperative in NAD Skin:  Intact without significant lesions or rashes. Eyes:  Sclera clear, no icterus.   Conjunctiva pink. Ears:  Normal auditory acuity. Nose:  No deformity, discharge,  or lesions. Mouth:  No deformity or lesions. Neck:  Supple; no masses or thyromegaly. No significant cervical adenopathy. Lungs:  Clear throughout to auscultation.    No wheezes, crackles, or rhonchi. No acute distress. Heart:  Regular rate and rhythm; no murmurs, clicks, rubs,  or gallops. Abdomen: Non-distended, normal bowel sounds.  Soft and nontender without appreciable mass or hepatosplenomegaly.  Pulses:  Normal pulses noted. Extremities:  Without clubbing or edema.  Impression/Plan:  History short segment Barrett's esophagus. GERD symptoms poorly controlled at night. Due for surveillance EGD this time. Overdue for a screening colonoscopy-average risk. I've offered patient both a surveillance EGD a screening colonoscopy in the near future.The risks, benefits, limitations, imponderables and alternatives regarding both EGD and colonoscopy have been reviewed with the patient. Questions have been answered. All parties agreeable.

## 2012-03-23 ENCOUNTER — Encounter: Payer: Self-pay | Admitting: *Deleted

## 2012-03-23 ENCOUNTER — Encounter: Payer: Self-pay | Admitting: Internal Medicine

## 2012-03-24 ENCOUNTER — Ambulatory Visit (HOSPITAL_COMMUNITY)
Admission: RE | Admit: 2012-03-24 | Discharge: 2012-03-24 | Disposition: A | Discharge status: Home / Self Care | Payer: Medicare Other | Source: Ambulatory Visit | Attending: Internal Medicine | Admitting: Internal Medicine

## 2012-03-24 DIAGNOSIS — K219 Gastro-esophageal reflux disease without esophagitis: Secondary | ICD-10-CM | POA: Insufficient documentation

## 2012-03-24 DIAGNOSIS — K225 Diverticulum of esophagus, acquired: Secondary | ICD-10-CM | POA: Insufficient documentation

## 2012-03-29 NOTE — Progress Notes (Signed)
Patient is scheduled with Dr. Pollyann Kennedy on Thursday October 3rd at 9:30 and she is aware and she wanted me to thank Dr. Jena Gauss for all of his hard work and great care he has given her.

## 2012-04-01 ENCOUNTER — Other Ambulatory Visit: Payer: Self-pay

## 2012-04-01 MED ORDER — DEXLANSOPRAZOLE 60 MG PO CPDR: 60.0000 mg | DELAYED_RELEASE_CAPSULE | Freq: Every day | ORAL | Status: DC

## 2012-04-25 ENCOUNTER — Telehealth: Payer: Self-pay | Admitting: Cardiology

## 2012-04-25 ENCOUNTER — Other Ambulatory Visit: Payer: Self-pay | Admitting: Cardiology

## 2012-04-25 DIAGNOSIS — E039 Hypothyroidism, unspecified: Secondary | ICD-10-CM

## 2012-04-25 NOTE — Telephone Encounter (Signed)
New problem:  Need an order for thyroid test to be done today in .

## 2012-04-25 NOTE — Telephone Encounter (Signed)
Order placed for TSH per dr Vern Claude note.

## 2012-04-28 ENCOUNTER — Telehealth: Payer: Self-pay | Admitting: *Deleted

## 2012-04-28 DIAGNOSIS — R7989 Other specified abnormal findings of blood chemistry: Secondary | ICD-10-CM

## 2012-04-28 NOTE — Telephone Encounter (Signed)
Pt returning call. TSH results given to pt. She is taking her thyroid medication daily. Will return to the [facility] office 1st or 2nd week of December to pick up lab orders for redraw of TSH. Mylo Red RN

## 2012-04-28 NOTE — Telephone Encounter (Signed)
Error Debbie Kiran Lapine RN  

## 2012-05-05 ENCOUNTER — Encounter: Payer: Self-pay | Admitting: *Deleted

## 2012-05-17 ENCOUNTER — Other Ambulatory Visit (HOSPITAL_COMMUNITY): Payer: Self-pay | Admitting: Family Medicine

## 2012-05-17 DIAGNOSIS — Z139 Encounter for screening, unspecified: Secondary | ICD-10-CM

## 2012-05-17 DIAGNOSIS — K219 Gastro-esophageal reflux disease without esophagitis: Secondary | ICD-10-CM

## 2012-05-17 DIAGNOSIS — K460 Unspecified abdominal hernia with obstruction, without gangrene: Secondary | ICD-10-CM

## 2012-05-19 ENCOUNTER — Ambulatory Visit (HOSPITAL_COMMUNITY)
Admission: RE | Admit: 2012-05-19 | Discharge: 2012-05-19 | Disposition: A | Discharge status: Home / Self Care | Payer: Medicare Other | Source: Ambulatory Visit | Attending: Family Medicine | Admitting: Family Medicine

## 2012-05-19 DIAGNOSIS — K219 Gastro-esophageal reflux disease without esophagitis: Secondary | ICD-10-CM

## 2012-05-19 DIAGNOSIS — K460 Unspecified abdominal hernia with obstruction, without gangrene: Secondary | ICD-10-CM

## 2012-05-19 DIAGNOSIS — Z1231 Encounter for screening mammogram for malignant neoplasm of breast: Secondary | ICD-10-CM | POA: Insufficient documentation

## 2012-05-19 DIAGNOSIS — Z139 Encounter for screening, unspecified: Secondary | ICD-10-CM

## 2012-06-17 ENCOUNTER — Other Ambulatory Visit: Payer: Self-pay | Admitting: *Deleted

## 2012-06-17 DIAGNOSIS — R7989 Other specified abnormal findings of blood chemistry: Secondary | ICD-10-CM

## 2012-07-14 ENCOUNTER — Other Ambulatory Visit: Payer: Self-pay | Admitting: Cardiology

## 2012-07-16 ENCOUNTER — Other Ambulatory Visit: Payer: Self-pay | Admitting: Cardiology

## 2012-08-18 ENCOUNTER — Other Ambulatory Visit: Payer: Self-pay | Admitting: Cardiology

## 2012-10-11 ENCOUNTER — Other Ambulatory Visit: Payer: Self-pay | Admitting: Cardiology

## 2012-11-25 ENCOUNTER — Other Ambulatory Visit: Payer: Self-pay | Admitting: Cardiology

## 2013-01-02 ENCOUNTER — Other Ambulatory Visit (HOSPITAL_COMMUNITY): Payer: Self-pay | Admitting: Family Medicine

## 2013-01-02 DIAGNOSIS — E785 Hyperlipidemia, unspecified: Secondary | ICD-10-CM

## 2013-01-03 ENCOUNTER — Other Ambulatory Visit (HOSPITAL_COMMUNITY): Payer: Self-pay | Admitting: Family Medicine

## 2013-01-03 DIAGNOSIS — Z780 Asymptomatic menopausal state: Secondary | ICD-10-CM

## 2013-01-03 DIAGNOSIS — N959 Unspecified menopausal and perimenopausal disorder: Secondary | ICD-10-CM

## 2013-01-03 DIAGNOSIS — M6790 Unspecified disorder of synovium and tendon, unspecified site: Secondary | ICD-10-CM

## 2013-01-03 DIAGNOSIS — M719 Bursopathy, unspecified: Secondary | ICD-10-CM

## 2013-01-10 ENCOUNTER — Ambulatory Visit (HOSPITAL_COMMUNITY)
Admission: RE | Admit: 2013-01-10 | Discharge: 2013-01-10 | Disposition: A | Discharge status: Home / Self Care | Payer: Medicare Other | Source: Ambulatory Visit | Attending: Family Medicine | Admitting: Family Medicine

## 2013-01-10 DIAGNOSIS — Z780 Asymptomatic menopausal state: Secondary | ICD-10-CM | POA: Insufficient documentation

## 2013-01-10 DIAGNOSIS — N959 Unspecified menopausal and perimenopausal disorder: Secondary | ICD-10-CM

## 2013-01-10 DIAGNOSIS — M818 Other osteoporosis without current pathological fracture: Secondary | ICD-10-CM | POA: Insufficient documentation

## 2013-02-08 ENCOUNTER — Other Ambulatory Visit: Payer: Self-pay

## 2013-02-08 MED ORDER — METOPROLOL TARTRATE 100 MG PO TABS: ORAL_TABLET | ORAL | Status: DC

## 2013-02-08 MED ORDER — ATORVASTATIN CALCIUM 10 MG PO TABS: ORAL_TABLET | ORAL | Status: DC

## 2013-07-18 ENCOUNTER — Other Ambulatory Visit: Payer: Self-pay | Admitting: Cardiology

## 2013-08-18 ENCOUNTER — Encounter (HOSPITAL_COMMUNITY): Payer: Self-pay | Admitting: Emergency Medicine

## 2013-08-18 ENCOUNTER — Emergency Department (HOSPITAL_COMMUNITY): Payer: Medicare Other

## 2013-08-18 ENCOUNTER — Emergency Department (HOSPITAL_COMMUNITY)
Admission: EM | Admit: 2013-08-18 | Discharge: 2013-08-18 | Disposition: A | Discharge status: Home / Self Care | Payer: Medicare Other | Attending: Emergency Medicine | Admitting: Emergency Medicine

## 2013-08-18 DIAGNOSIS — I2510 Atherosclerotic heart disease of native coronary artery without angina pectoris: Secondary | ICD-10-CM | POA: Insufficient documentation

## 2013-08-18 DIAGNOSIS — Z9861 Coronary angioplasty status: Secondary | ICD-10-CM | POA: Insufficient documentation

## 2013-08-18 DIAGNOSIS — K219 Gastro-esophageal reflux disease without esophagitis: Secondary | ICD-10-CM | POA: Insufficient documentation

## 2013-08-18 DIAGNOSIS — Y9389 Activity, other specified: Secondary | ICD-10-CM | POA: Insufficient documentation

## 2013-08-18 DIAGNOSIS — Y929 Unspecified place or not applicable: Secondary | ICD-10-CM | POA: Insufficient documentation

## 2013-08-18 DIAGNOSIS — W08XXXA Fall from other furniture, initial encounter: Secondary | ICD-10-CM | POA: Insufficient documentation

## 2013-08-18 DIAGNOSIS — Z79899 Other long term (current) drug therapy: Secondary | ICD-10-CM | POA: Insufficient documentation

## 2013-08-18 DIAGNOSIS — E785 Hyperlipidemia, unspecified: Secondary | ICD-10-CM | POA: Insufficient documentation

## 2013-08-18 DIAGNOSIS — R4181 Age-related cognitive decline: Secondary | ICD-10-CM | POA: Insufficient documentation

## 2013-08-18 DIAGNOSIS — Z7982 Long term (current) use of aspirin: Secondary | ICD-10-CM | POA: Insufficient documentation

## 2013-08-18 DIAGNOSIS — S298XXA Other specified injuries of thorax, initial encounter: Secondary | ICD-10-CM | POA: Insufficient documentation

## 2013-08-18 DIAGNOSIS — I10 Essential (primary) hypertension: Secondary | ICD-10-CM | POA: Insufficient documentation

## 2013-08-18 DIAGNOSIS — W19XXXA Unspecified fall, initial encounter: Secondary | ICD-10-CM

## 2013-08-18 DIAGNOSIS — R918 Other nonspecific abnormal finding of lung field: Secondary | ICD-10-CM

## 2013-08-18 DIAGNOSIS — IMO0002 Reserved for concepts with insufficient information to code with codable children: Secondary | ICD-10-CM | POA: Insufficient documentation

## 2013-08-18 DIAGNOSIS — Z87891 Personal history of nicotine dependence: Secondary | ICD-10-CM | POA: Insufficient documentation

## 2013-08-18 LAB — TROPONIN I: Troponin I: <0.3 ng/mL (ref <0.30)

## 2013-08-18 LAB — BASIC METABOLIC PANEL
BUN: 18 mg/dL (ref 6–23)
CO2: 24 mEq/L (ref 19–32)
Calcium: 10.2 mg/dL (ref 8.4–10.5)
Chloride: 97 mEq/L (ref 96–112)
Creatinine, Ser: 0.9 mg/dL (ref 0.50–1.10)
GFR calc non Af Amer: 58 mL/min — ABNORMAL LOW (ref >90)
GFR, EST AFRICAN AMERICAN: 67 mL/min — AB (ref >90)
Glucose, Bld: 106 mg/dL — ABNORMAL HIGH (ref 70–99)
POTASSIUM: 4.7 meq/L (ref 3.7–5.3)
Sodium: 135 mEq/L — ABNORMAL LOW (ref 137–147)

## 2013-08-18 LAB — CBC WITH DIFFERENTIAL/PLATELET
Basophils Absolute: 0 10*3/uL (ref 0.0–0.1)
Basophils Relative: 1 % (ref 0–1)
EOS PCT: 3 % (ref 0–5)
Eosinophils Absolute: 0.3 10*3/uL (ref 0.0–0.7)
HCT: 38.7 % (ref 36.0–46.0)
HEMOGLOBIN: 12.8 g/dL (ref 12.0–15.0)
LYMPHS PCT: 19 % (ref 12–46)
Lymphs Abs: 1.6 10*3/uL (ref 0.7–4.0)
MCH: 31.9 pg (ref 26.0–34.0)
MCHC: 33.1 g/dL (ref 30.0–36.0)
MCV: 96.5 fL (ref 78.0–100.0)
MONO ABS: 1.2 10*3/uL — AB (ref 0.1–1.0)
Monocytes Relative: 14 % — ABNORMAL HIGH (ref 3–12)
Neutro Abs: 5.3 10*3/uL (ref 1.7–7.7)
Neutrophils Relative %: 63 % (ref 43–77)
Platelets: 347 10*3/uL (ref 150–400)
RBC: 4.01 MIL/uL (ref 3.87–5.11)
RDW: 14.5 % (ref 11.5–15.5)
WBC: 8.5 10*3/uL (ref 4.0–10.5)

## 2013-08-18 MED ORDER — HYDROCODONE-ACETAMINOPHEN 5-325 MG PO TABS: 1.0000 | ORAL_TABLET | Freq: Four times a day (QID) | ORAL | Status: DC | PRN

## 2013-08-18 MED ORDER — SODIUM CHLORIDE 0.9 % IJ SOLN
INTRAMUSCULAR | Status: AC
  Filled 2013-08-18: qty 800

## 2013-08-18 MED ORDER — IOHEXOL 300 MG/ML  SOLN
80.0000 mL | Freq: Once | INTRAMUSCULAR | Status: AC | PRN
  Administered 2013-08-18: 80 mL via INTRAVENOUS

## 2013-08-18 NOTE — ED Notes (Addendum)
Pt reports falling on Monday and reports intermittent chest pain and back pain since fall. Pt alert and oriented. Pt denies any dizziness,LOC or changes in vision at this time or at time of fall. Pt reports took tylenol for pain this am. Pt reports takes daily aspirin but denies being on any additional blood thinners. Pt denies any n/v/d. No obvious deformities noted.

## 2013-08-18 NOTE — Discharge Instructions (Signed)
Pulmonary Mass  Follow-up treatment or testing is based on the size of the pulmonary nodule and your risk of getting lung cancer.  CAUSES Benign pulmonary nodules can be caused by various things. Some of the causes include:   Bacterial, fungal, or viral infections. This is usually an old infection that is no longer active, but it can sometimes be a current, active infection.  A benign mass of tissue.  Inflammation from conditions such as rheumatoid arthritis.   Abnormal blood vessels in the lungs. Malignant pulmonary nodules can result from lung cancer or from cancers that spread to the lung from other places in the body. SIGNS AND SYMPTOMS Pulmonary nodules usually do not cause symptoms. DIAGNOSIS Most often, pulmonary nodules are found incidentally when an X-ray or CT scan is performed to look for some other problem in the lung area. To help determine whether a pulmonary nodule is benign or malignant, your health care provider will take a medical history and order a variety of tests. Tests done may include:   Blood tests.  A skin test called a tuberculin test. This test is used to determine if you have been exposed to the germ that causes tuberculosis.   Chest X-rays. If possible, a new X-ray may be compared with X-rays you have had in the past.   CT scan. This test shows smaller pulmonary nodules more clearly than an X-ray.   Positron emission tomography (PET) scan. In this test, a safe amount of a radioactive substance is injected into the bloodstream. Then, the scan takes a picture of the pulmonary nodule. The radioactive substance is eliminated from your body in your urine.   Biopsy. A tiny piece of the pulmonary nodule is removed so it can be checked under a microscope. TREATMENT  Pulmonary nodules that are benign normally do not require any treatment because they usually do not cause symptoms or breathing problems. Your health care provider may want to monitor the  pulmonary nodule through follow-up CT scans. The frequency of these CT scans will vary based on the size of the nodule and the risk factors for lung cancer. For example, CT scans will need to be done more frequently if the pulmonary nodule is larger and if you have a history of smoking and a family history of cancer. Further testing or biopsies may be done if any follow-up CT scan shows that the size of the pulmonary nodule has increased. HOME CARE INSTRUCTIONS  Only take over-the-counter or prescription medicines as directed by your health care provider.  Keep all follow-up appointments with your health care provider. SEEK MEDICAL CARE IF:  You have trouble breathing when you are active.   You feel sick or unusually tired.   You do not feel like eating.   You lose weight without trying to.   You develop chills or night sweats.  SEEK IMMEDIATE MEDICAL CARE IF:  You cannot catch your breath, or you begin wheezing.   You cannot stop coughing.   You cough up blood.   You become dizzy or feel like you are going to pass out.   You have sudden chest pain.   You have a fever or persistent symptoms for more than 2 3 days.   You have a fever and your symptoms suddenly get worse. MAKE SURE YOU:  Understand these instructions.  Will watch your condition.  Will get help right away if you are not doing well or get worse. Document Released: 04/12/2009 Document Revised: 02/15/2013 Document Reviewed:  12/05/2012 [employer] Patient Information 2014 [place]. Fall Prevention and Home Safety Falls cause injuries and can affect all age groups. It is possible to use preventive measures to significantly decrease the likelihood of falls. There are many simple measures which can make your home safer and prevent falls. OUTDOORS  Repair cracks and edges of walkways and driveways.  Remove high doorway thresholds.  Trim shrubbery on the main path into your home.  Have good  outside lighting.  Clear walkways of tools, rocks, debris, and clutter.  Check that handrails are not broken and are securely fastened. Both sides of steps should have handrails.  Have leaves, snow, and ice cleared regularly.  Use sand or salt on walkways during winter months.  In the garage, clean up grease or oil spills. BATHROOM  Install night lights.  Install grab bars by the toilet and in the tub and shower.  Use non-skid mats or decals in the tub or shower.  Place a plastic non-slip stool in the shower to sit on, if needed.  Keep floors dry and clean up all water on the floor immediately.  Remove soap buildup in the tub or shower on a regular basis.  Secure bath mats with non-slip, double-sided rug tape.  Remove throw rugs and tripping hazards from the floors. BEDROOMS  Install night lights.  Make sure a bedside light is easy to reach.  Do not use oversized bedding.  Keep a telephone by your bedside.  Have a firm chair with side arms to use for getting dressed.  Remove throw rugs and tripping hazards from the floor. KITCHEN  Keep handles on pots and pans turned toward the center of the stove. Use back burners when possible.  Clean up spills quickly and allow time for drying.  Avoid walking on wet floors.  Avoid hot utensils and knives.  Position shelves so they are not too high or low.  Place commonly used objects within easy reach.  If necessary, use a sturdy step stool with a grab bar when reaching.  Keep electrical cables out of the way.  Do not use floor polish or wax that makes floors slippery. If you must use wax, use non-skid floor wax.  Remove throw rugs and tripping hazards from the floor. STAIRWAYS  Never leave objects on stairs.  Place handrails on both sides of stairways and use them. Fix any loose handrails. Make sure handrails on both sides of the stairways are as long as the stairs.  Check carpeting to make sure it is firmly  attached along stairs. Make repairs to worn or loose carpet promptly.  Avoid placing throw rugs at the top or bottom of stairways, or properly secure the rug with carpet tape to prevent slippage. Get rid of throw rugs, if possible.  Have an electrician put in a light switch at the top and bottom of the stairs. OTHER FALL PREVENTION TIPS  Wear low-heel or rubber-soled shoes that are supportive and fit well. Wear closed toe shoes.  When using a stepladder, make sure it is fully opened and both spreaders are firmly locked. Do not climb a closed stepladder.  Add color or contrast paint or tape to grab bars and handrails in your home. Place contrasting color strips on first and last steps.  Learn and use mobility aids as needed. Install an electrical emergency response system.  Turn on lights to avoid dark areas. Replace light bulbs that burn out immediately. Get light switches that glow.  Arrange furniture to create  clear pathways. Keep furniture in the same place.  Firmly attach carpet with non-skid or double-sided tape.  Eliminate uneven floor surfaces.  Select a carpet pattern that does not visually hide the edge of steps.  Be aware of all pets. OTHER HOME SAFETY TIPS  Set the water temperature for 120 F (48.8 C).  Keep emergency numbers on or near the telephone.  Keep smoke detectors on every level of the home and near sleeping areas. Document Released: 06/05/2002 Document Revised: 12/15/2011 Document Reviewed: 09/04/2011 [facility] Patient Information 2014 [place].

## 2013-08-18 NOTE — ED Provider Notes (Signed)
CSN: [id number]     Arrival date & time 08/18/13  1033 History  This chart was scribed for Merryl Hacker, MD by Adriana Reams, ED Scribe. This patient was seen in room APA05/APA05 and the patient's care was started at 1054.    First MD Initiated Contact with Patient 08/18/13 1054     Chief Complaint  Patient presents with  . Fall      The history is provided by the patient. No language interpreter was used.   HPI Comments: DAESIA ZYLKA is a 80 y.o. female who presents to the Emergency Department complaining of a fall which occurred 6 day ago. She was trying to sit on a stool and missed it. She denies any dizziness, hitting her head, LOC or changes in vision at the time of the fall. She has been able to ambulate since the fall, and was not evaluated at the time of the fall. She has had intermittent CP and back pain since the fall. She took Tylenol this morning with some relief of the pain. She denies nausea, vomiting, diarrhea, incontinence, weakness or numbness in her extremities, or any other symptoms. She reports taking daily Aspirin but denies taking any additional blood thinners.   Past Medical History  Diagnosis Date  . Coronary artery disease 2000    Status post stent placement  . Hyperlipidemia   . Hypertension   . Cardiac tamponade   . Supraventricular tachycardia   . Ascending aortic aneurysm   . Dyspnea on exertion   . GERD (gastroesophageal reflux disease)   . Barrett's esophagus    Past Surgical History  Procedure Laterality Date  . Colonoscopy  01/10/2001    Normal  . Egd with biopsy  12/03/2005  . Abdominal aortic aneurysm repair  06/12/2005  . Abdominal aortogram    . Laminectomies      3  . Esophagogastroduodenoscopy  04/10/2009    Dr. Gala Romney short segment of salmon-colored epithelium consistent with prior short segment Barrett's (biopsy benign)  . Cholecystectomy  07/09/10    Chronic cholelithiasis Dr. Arnoldo Morale   Family History  Problem Relation Age of  Onset  . Heart disease Mother   . Diabetes Mother   . Heart failure Father    History  Substance Use Topics  . Smoking status: Former Smoker -- 1.00 packs/day for 40 years    Types: Cigarettes    Quit date: 06/29/1993  . Smokeless tobacco: Not on file  . Alcohol Use: Yes     Comment: small glass of wine 3-4 times per yr   OB History   Grav Para Term Preterm Abortions TAB SAB Ect Mult Living                 Review of Systems  Constitutional: Negative for fever.  Respiratory: Positive for chest tightness. Negative for cough and shortness of breath.   Cardiovascular: Negative for chest pain.  Gastrointestinal: Negative for nausea, vomiting and abdominal pain.  Genitourinary: Negative for dysuria.  Musculoskeletal: Positive for back pain. Negative for gait problem.  Skin: Negative for wound.  Neurological: Negative for dizziness, weakness, numbness and headaches.  Psychiatric/Behavioral: Negative for confusion.  All other systems reviewed and are negative.      Allergies  Review of patient's allergies indicates no known allergies.  Home Medications   Current Outpatient Rx  Name  Route  Sig  Dispense  Refill  . acetaminophen (TYLENOL) 500 MG tablet   Oral   Take 1,000 mg by mouth  daily as needed for moderate pain.         Marland Kitchen aspirin 81 MG tablet   Oral   Take 81 mg by mouth daily.           Marland Kitchen atorvastatin (LIPITOR) 10 MG tablet      TAKE 1 TABLET (10 MG TOTAL) BY MOUTH DAILY.   30 tablet   3     .Marland KitchenPatient needs to contact office to schedule  App ...   . dexlansoprazole (DEXILANT) 60 MG capsule   Oral   Take 1 capsule (60 mg total) by mouth daily.   15 capsule   0   . levothyroxine (SYNTHROID, LEVOTHROID) 50 MCG tablet   Oral   Take 50 mcg by mouth daily before breakfast.         . metoprolol (LOPRESSOR) 100 MG tablet      TAKE 1 TABLET BY MOUTH TWICE A DAY   60 tablet   0     **NO FURTHER REFILLS WITHOUT APPOINTMENT**   . omeprazole  (PRILOSEC OTC) 20 MG tablet   Oral   Take 20 mg by mouth daily.         . quinapril (ACCUPRIL) 20 MG tablet      TAKE 1 TABLET EVERY DAY   30 tablet   5   . HYDROcodone-acetaminophen (NORCO/VICODIN) 5-325 MG per tablet   Oral   Take 1 tablet by mouth every 6 (six) hours as needed.   15 tablet   0    BP 146/74  Pulse 69  Temp(Src) 98.7 F (37.1 C) (Oral)  Resp 16  Ht 5\' 3"  (1.6 m)  Wt 110 lb (49.896 kg)  BMI 19.49 kg/m2  SpO2 97%  Physical Exam  Nursing note and vitals reviewed. Constitutional: She is oriented to person, place, and time. No distress.  Elderly, frail  HENT:  Head: Normocephalic and atraumatic.  Eyes: EOM are normal. Pupils are equal, round, and reactive to light.  Neck: Neck supple.  No midline C-spine tenderness  Cardiovascular: Normal rate, regular rhythm and normal heart sounds.   No murmur heard. Pulmonary/Chest: Effort normal and breath sounds normal. No respiratory distress. She has no wheezes.  Abdominal: Soft. Bowel sounds are normal. There is no tenderness. There is no rebound.  Musculoskeletal: She exhibits no edema.  Full range of motion of the bilateral hips and bilateral knees, tenderness palpation over the lower lumbar spine without step off deformity  Neurological: She is alert and oriented to person, place, and time.  5 out of 5 strength in all 4 extremities  Skin: Skin is warm and dry.  Psychiatric: She has a normal mood and affect.    ED Course  Procedures (including critical care time) DIAGNOSTIC STUDIES: Oxygen Saturation is 97% on RA, adequate by my interpretation.    COORDINATION OF CARE: 11:08 AM Discussed treatment plan which includes hip xray, lumbar spine xray, CRX, EKG and troponin with pt at bedside and pt agreed to plan.    Labs Review Labs Reviewed  BASIC METABOLIC PANEL - Abnormal; Notable for the following:    Sodium 135 (*)    Glucose, Bld 106 (*)    GFR calc non Af Amer 58 (*)    GFR calc Af Amer 67 (*)     All other components within normal limits  CBC WITH DIFFERENTIAL - Abnormal; Notable for the following:    Monocytes Relative 14 (*)    Monocytes Absolute 1.2 (*)    All  other components within normal limits  TROPONIN I   Imaging Review Dg Chest 2 View  08/18/2013   CLINICAL DATA:  [place].  Cough.  Chest pain.  EXAM: CHEST  2 VIEW  COMPARISON:  06/13/2005.  Chest CT 05/08/2005  FINDINGS: Heart size is normal. The aorta shows calcification and unfolding. At the aortic arch, there appears to be a calcified aneurysm. This could measure up to 4.5 cm in diameter. This was noted in 2006 but appears larger.  The left lung is clear. There is abnormal density in the right upper lobe that could represent pneumonia or mass lesion. No effusions.  There is loss of height of a mid thoracic vertebral body of indeterminate age. No other spinal finding.  IMPRESSION: Right upper lobe pneumonia and/or mass lesion. CT scan of the chest with contrast recommended.  Calcified aneurysm of the aortic arch. This appears to have enlarged since previous studies. This could be evaluated by CT as well.  Mid thoracic compression deformity of indeterminate age.   Electronically Signed   By: Nelson Chimes M.D.   On: 08/18/2013 12:59   Dg Lumbar Spine Complete  08/18/2013   CLINICAL DATA:  Fall.  Bilateral hip pain.  EXAM: LUMBAR SPINE - COMPLETE 4+ VIEW  COMPARISON:  CT 03/26/2007  FINDINGS: Alignment is normal. No fracture. There chronic superior endplate osteophytes at L3. There is disc space narrowing and lower lumbar facet degeneration. No other focal finding.  IMPRESSION: No acute or traumatic finding. Ordinary degenerative changes as outlined above.   Electronically Signed   By: Nelson Chimes M.D.   On: 08/18/2013 12:55   Dg Hip Bilateral W/pelvis  08/18/2013   CLINICAL DATA:  [place].  Bilateral hip pain.  EXAM: BILATERAL HIP WITH PELVIS - 4+ VIEW  COMPARISON:  None.  FINDINGS: No evidence of fracture. There are ordinary mild  osteoarthritic changes of both hips. No focal lesion.  IMPRESSION: No acute or traumatic finding.  Mild osteoarthritis of the hips.   Electronically Signed   By: Nelson Chimes M.D.   On: 08/18/2013 12:54   Ct Chest W Contrast  08/18/2013   CLINICAL DATA:  Abnormal chest x-ray suspicious for right upper lobe mass  EXAM: CT CHEST WITH CONTRAST  TECHNIQUE: Multidetector CT imaging of the chest was performed during intravenous contrast administration.  CONTRAST:  74mL OMNIPAQUE IOHEXOL 300 MG/ML  SOLN  COMPARISON:  DG CHEST 2 VIEW dated 08/18/2013  FINDINGS: There is an abnormal pleural-based mass in the anterior aspect of the right upper lobe. This measures 3.4 cm AP x 4.5 cm transversely x 3.9 cm in superior to inferior dimension. It exhibits heterogeneous density with low density areas suggesting necrosis. There are destructive changes of the posterior lateral aspect of the adjacent right first rib. There is increased interstitial density elsewhere in the right upper lobe inferior to the mass. There is a subcentimeter nodule demonstrated on image 16 laterally in the right upper lobe. The right middle and lower lobes exhibit no acute abnormalities. There are emphysematous changes in both lungs with large bullous lesions noted in the left upper lobe. No pulmonary masses or nodules in the left lung are demonstrated.  Soft tissue density radiates inferiorly from the right upper lobe mass to the anterior right suprahilar region. There is a right hilar lymph node on image 19 which measures 16.5 x 13.2 cm. There is a right paratracheal lymph node measuring 1.7 x 1.5 cm on image 18. There may be subcarinal lymphadenopathy measuring  2.3 by 1 cm. No left hilar lymphadenopathy is demonstrated. There is no pleural nor pericardial effusion. The cardiac chambers are normal in size. There is dilation of the mid and posterior aspect of the aortic arch with mural thrombus. The maximal diameter is 4.1 cm. The ascending and descending  thoracic aorta exhibit no evidence of aneurysm or significant mural thrombus.  Within the upper abdomen the liver demonstrates a somewhat heterogeneous density but a discrete mass is not demonstrated. No adrenal masses are evident. There is no splenomegaly. There is a 1 cm diameter accessory spleen. The visualized portions of the right kidney appear atrophic and nonfunctioning. The visualized portion of the left kidney appears normal.  There is mild loss of height of the body of T9 without evidence of lytic lesions.  IMPRESSION: 1. There is an abnormal pleural-based mass in the right upper lobe anteriorly with invasion of the adjacent first rib posteriorly. The findings are highly suspicious for malignancy. Follow-up whole-body PET-CT scanning is recommended. 2. There is an enlarged right suprahilar lymph node and right peritracheal lymph node. There may be subcarinal lymphadenopathy. 3. No parenchymal nodules are demonstrated in the left lung or in the right middle or lower lobe. There are emphysematous changes bilaterally. 4. There is no evidence of CHF. There is no pleural nor pericardial effusion. There is dilation of the mid posterior aspect of the aortic arch to 4.1 cm with evidence of mural thrombus. 5. The density of the liver is somewhat heterogeneous, but no discrete masses are demonstrated. No adrenal masses are demonstrated.   Electronically Signed   By: David  Martinique   On: 08/18/2013 15:21    EKG Interpretation    Date/Time:  Friday August 18 2013 11:28:18 EST Ventricular Rate:  66 PR Interval:  148 QRS Duration: 74 QT Interval:  396 QTC Calculation: 415 R Axis:   0 Text Interpretation:  Normal sinus rhythm Normal ECG When compared with ECG of 03-Jul-2010 09:28, Questionable change in QRS axis Confirmed by Brettany Sydney  MD, Shavonne Ambroise ([id number]) on 08/18/2013 1:05:02 PM            MDM   Final diagnoses:  Fall  Lung mass    Patient presents after a mechanical fall 5 days ago.   Nontoxic.  Exam without evidence of trauma.  Endorses CP without tenderness.  EKG nonischemic.  Xrays of back and pelvis neg.  CXR with mass.  Patient has significant smoking history.  CT shows mass concerning for cancer.  Shared results with patient.  Patient given copies of radiology reports.  Patient to f/u with PCP for oncology referral.  After history, exam, and medical workup I feel the patient has been appropriately medically screened and is safe for discharge home. Pertinent diagnoses were discussed with the patient. Patient was given return precautions.  I personally performed the services described in this documentation, which was scribed in my presence. The recorded information has been reviewed and is accurate.    Merryl Hacker, MD 08/18/13 [phone]

## 2013-08-18 NOTE — ED Notes (Signed)
Pt alert & oriented x4, stable gait. Patient given discharge instructions, paperwork & prescription(s). Patient  instructed to stop at the registration desk to finish any additional paperwork. Patient verbalized understanding. Pt left department w/ no further questions. 

## 2013-08-28 ENCOUNTER — Institutional Professional Consult (permissible substitution) (INDEPENDENT_AMBULATORY_CARE_PROVIDER_SITE_OTHER): Payer: Medicare Other | Admitting: Thoracic Surgery (Cardiothoracic Vascular Surgery)

## 2013-08-28 ENCOUNTER — Encounter: Payer: Self-pay | Admitting: Thoracic Surgery (Cardiothoracic Vascular Surgery)

## 2013-08-28 ENCOUNTER — Other Ambulatory Visit: Payer: Self-pay | Admitting: *Deleted

## 2013-08-28 VITALS — BP 144/80 | HR 92 | Resp 16 | Ht 63.0 in | Wt 98.0 lb

## 2013-08-28 DIAGNOSIS — R222 Localized swelling, mass and lump, trunk: Secondary | ICD-10-CM

## 2013-08-28 DIAGNOSIS — C3411 Malignant neoplasm of upper lobe, right bronchus or lung: Secondary | ICD-10-CM

## 2013-08-28 DIAGNOSIS — C3410 Malignant neoplasm of upper lobe, unspecified bronchus or lung: Secondary | ICD-10-CM

## 2013-08-28 DIAGNOSIS — R591 Generalized enlarged lymph nodes: Secondary | ICD-10-CM

## 2013-08-28 DIAGNOSIS — I71012 Dissection of descending thoracic aorta: Secondary | ICD-10-CM | POA: Insufficient documentation

## 2013-08-28 DIAGNOSIS — R599 Enlarged lymph nodes, unspecified: Secondary | ICD-10-CM

## 2013-08-28 DIAGNOSIS — R918 Other nonspecific abnormal finding of lung field: Secondary | ICD-10-CM

## 2013-08-28 DIAGNOSIS — I71019 Dissection of thoracic aorta, unspecified: Secondary | ICD-10-CM

## 2013-08-28 DIAGNOSIS — I7101 Dissection of thoracic aorta: Secondary | ICD-10-CM

## 2013-08-28 NOTE — Progress Notes (Signed)
PCP is Leonides Grills, MD Referring Provider is Elsie Lincoln, MD  Chief Complaint  Patient presents with  . Lung Mass    right upper lobe per CT CHEST WITH CONTRAST @ [facility]  . Adenopathy    HPI: 80 yo woman with a past history of smoking presents with right shoulder pain and weight loss.  [name] is an 80 yo woman with multiple medical problems including a 40 pack year history of tobacco abuse. She has been having right shoulder pain for the past "couple of months." About 2 weeks ago she fell trying to sit on a stool and landed on her right shoulder. She had an x-ray done which showed a right apical lung mass. She then had a CT of the chest which showed a 3.4 x 4.5 x 3.9 cm Pancoast tumor in the right superior sulcus with invasion of the first rib. There was hilar and right paratracheal adenopathy on the right as well.   She has lost 22 pounds in the past 3 months. She has a history of a cough, but says it is less frequent since January. She denies hemoptysis. She also c/o blurry vision which is new over the past week.  ECOG/ ZUBROD = 1   Past Medical History  Diagnosis Date  . Coronary artery disease 2000    Status post stent placement  . Hyperlipidemia   . Hypertension   . Cardiac tamponade   . Supraventricular tachycardia   . Abdominal aortic aneurysm     Repair Dr. Kellie Simmering 2006  . Dyspnea on exertion   . GERD (gastroesophageal reflux disease)   . Barrett's esophagus   . Pancoast tumor of right lung   . Descending thoracic aortic dissection 1995    Past Surgical History  Procedure Laterality Date  . Colonoscopy  01/10/2001    Normal  . Egd with biopsy  12/03/2005  . Abdominal aortic aneurysm repair  06/12/2005  . Abdominal aortogram    . Laminectomies      3  . Esophagogastroduodenoscopy  04/10/2009    Dr. Gala Romney short segment of salmon-colored epithelium consistent with prior short segment Barrett's (biopsy benign)  . Cholecystectomy  07/09/10    Chronic  cholelithiasis Dr. Arnoldo Morale    Family History  Problem Relation Age of Onset  . Heart disease Mother   . Diabetes Mother   . Heart failure Father     Social History History  Substance Use Topics  . Smoking status: Former Smoker -- 1.00 packs/day for 40 years    Types: Cigarettes    Quit date: 06/29/1993  . Smokeless tobacco: Not on file  . Alcohol Use: No     Comment: small glass of wine 3-4 times per yr    Current Outpatient Prescriptions  Medication Sig Dispense Refill  . acetaminophen (TYLENOL) 500 MG tablet Take 1,000 mg by mouth daily as needed for moderate pain.      Marland Kitchen aspirin 81 MG tablet Take 81 mg by mouth daily.        Marland Kitchen atorvastatin (LIPITOR) 10 MG tablet TAKE 1 TABLET (10 MG TOTAL) BY MOUTH DAILY.  30 tablet  3  . dexlansoprazole (DEXILANT) 60 MG capsule Take 1 capsule (60 mg total) by mouth daily.  15 capsule  0  . HYDROcodone-acetaminophen (NORCO/VICODIN) 5-325 MG per tablet Take 1 tablet by mouth every 6 (six) hours as needed.  15 tablet  0  . levothyroxine (SYNTHROID, LEVOTHROID) 50 MCG tablet Take 50 mcg by mouth daily before  breakfast.      . metoprolol (LOPRESSOR) 100 MG tablet TAKE 1 TABLET BY MOUTH TWICE A DAY  60 tablet  0  . omeprazole (PRILOSEC OTC) 20 MG tablet Take 20 mg by mouth daily.      . quinapril (ACCUPRIL) 20 MG tablet TAKE 1 TABLET EVERY DAY  30 tablet  5   No current facility-administered medications for this visit.    No Known Allergies  Review of Systems  Constitutional: Positive for appetite change, fatigue and unexpected weight change (lost 22 pounds in 3 months). Negative for fever.  Eyes: Positive for visual disturbance.  Respiratory: Positive for cough and shortness of breath.   Cardiovascular: Negative for chest pain.       History of CAD, AAA, descending dissection  Gastrointestinal: Positive for abdominal pain and constipation.  Musculoskeletal: Positive for gait problem (balance difficulties).  Neurological: Positive for  dizziness. Negative for seizures, syncope and speech difficulty.       Memory loss  All other systems reviewed and are negative.    BP 144/80  Pulse 92  Resp 16  Ht 5\' 3"  (1.6 m)  Wt 98 lb (44.453 kg)  BMI 17.36 kg/m2  SpO2 98% Physical Exam  Vitals reviewed. Constitutional: She is oriented to person, place, and time.  Thin frail 80 yo woman in NAD  HENT:  Head: Normocephalic and atraumatic.  Eyes: Pupils are equal, round, and reactive to light.  Neck: Neck supple. No thyromegaly present.  Cardiovascular: Normal rate, regular rhythm and normal heart sounds.   No murmur heard. Pulmonary/Chest: Effort normal and breath sounds normal. She has no wheezes. She has no rales.  Abdominal: Soft. There is no tenderness.  Musculoskeletal: She exhibits no edema.  Lymphadenopathy:    She has no cervical adenopathy.  Neurological: She is alert and oriented to person, place, and time.  No focal motor weakness  Skin: Skin is warm and dry.     Diagnostic Tests: CT CHEST WITH CONTRAST 08/18/2013 TECHNIQUE:  Multidetector CT imaging of the chest was performed during  intravenous contrast administration.  CONTRAST: 32mL OMNIPAQUE IOHEXOL 300 MG/ML SOLN  COMPARISON: DG CHEST 2 VIEW dated 08/18/2013  FINDINGS:  There is an abnormal pleural-based mass in the anterior aspect of  the right upper lobe. This measures 3.4 cm AP x 4.5 cm transversely  x 3.9 cm in superior to inferior dimension. It exhibits  heterogeneous density with low density areas suggesting necrosis.  There are destructive changes of the posterior lateral aspect of the  adjacent right first rib. There is increased interstitial density  elsewhere in the right upper lobe inferior to the mass. There is a  subcentimeter nodule demonstrated on image 16 laterally in the right  upper lobe. The right middle and lower lobes exhibit no acute  abnormalities. There are emphysematous changes in both lungs with  large bullous lesions  noted in the left upper lobe. No pulmonary  masses or nodules in the left lung are demonstrated.  Soft tissue density radiates inferiorly from the right upper lobe  mass to the anterior right suprahilar region. There is a right hilar  lymph node on image 19 which measures 16.5 x 13.2 cm. There is a  right paratracheal lymph node measuring 1.7 x 1.5 cm on image 18.  There may be subcarinal lymphadenopathy measuring 2.3 by 1 cm. No  left hilar lymphadenopathy is demonstrated. There is no pleural nor  pericardial effusion. The cardiac chambers are normal in size. There  is dilation of the mid and posterior aspect of the aortic arch with  mural thrombus. The maximal diameter is 4.1 cm. The ascending and  descending thoracic aorta exhibit no evidence of aneurysm or  significant mural thrombus.  Within the upper abdomen the liver demonstrates a somewhat  heterogeneous density but a discrete mass is not demonstrated. No  adrenal masses are evident. There is no splenomegaly. There is a 1  cm diameter accessory spleen. The visualized portions of the right  kidney appear atrophic and nonfunctioning. The visualized portion of  the left kidney appears normal.  There is mild loss of height of the body of T9 without evidence of  lytic lesions.  IMPRESSION:  1. There is an abnormal pleural-based mass in the right upper lobe  anteriorly with invasion of the adjacent first rib posteriorly. The  findings are highly suspicious for malignancy. Follow-up whole-body  PET-CT scanning is recommended.  2. There is an enlarged right suprahilar lymph node and right  peritracheal lymph node. There may be subcarinal lymphadenopathy.  3. No parenchymal nodules are demonstrated in the left lung or in  the right middle or lower lobe. There are emphysematous changes  bilaterally.  4. There is no evidence of CHF. There is no pleural nor pericardial  effusion. There is dilation of the mid posterior aspect of the   aortic arch to 4.1 cm with evidence of mural thrombus.  5. The density of the liver is somewhat heterogeneous, but no  discrete masses are demonstrated. No adrenal masses are  demonstrated.  Electronically Signed  By: David Martinique  On: 08/18/2013 15:21    Impression: 80 yo former smoker with right shoulder pain and weight loss. CT shows a right superior sulcus(Pancoast) tumor and hilar and mediastinal adenopathy.  This is likely T3N2, stage IIIA lesion based on CT. Chemotherapy and radiation are likely to be the treatments of choice once staging is completed.   We need to complete her workup including  1. PET/ CT for staging 2. MR of brain to rule out brain mets- new blurred vision is concerning 3. CT guided biopsy for definitive diagnosis  We will obtain these studies and then proceed with Oncology and Radiation Oncology referrals. Her husband had treatment by Dr. Valere Dross for prostate cancer and interested in having him treat [name] if possible.

## 2013-08-29 ENCOUNTER — Other Ambulatory Visit: Payer: Self-pay | Admitting: *Deleted

## 2013-08-29 DIAGNOSIS — R918 Other nonspecific abnormal finding of lung field: Secondary | ICD-10-CM

## 2013-09-01 ENCOUNTER — Other Ambulatory Visit: Payer: Self-pay | Admitting: Radiology

## 2013-09-04 ENCOUNTER — Encounter (HOSPITAL_COMMUNITY): Payer: Self-pay | Admitting: Pharmacy Technician

## 2013-09-05 ENCOUNTER — Encounter (HOSPITAL_COMMUNITY): Payer: Self-pay

## 2013-09-05 ENCOUNTER — Ambulatory Visit (HOSPITAL_COMMUNITY)
Admission: RE | Admit: 2013-09-05 | Discharge: 2013-09-05 | Disposition: A | Discharge status: Home / Self Care | Payer: Medicare Other | Source: Ambulatory Visit | Attending: Thoracic Surgery (Cardiothoracic Vascular Surgery) | Admitting: Thoracic Surgery (Cardiothoracic Vascular Surgery)

## 2013-09-05 ENCOUNTER — Other Ambulatory Visit: Payer: Self-pay | Admitting: Interventional Radiology

## 2013-09-05 ENCOUNTER — Other Ambulatory Visit: Payer: Self-pay

## 2013-09-05 ENCOUNTER — Other Ambulatory Visit: Payer: Self-pay | Admitting: Thoracic Surgery (Cardiothoracic Vascular Surgery)

## 2013-09-05 DIAGNOSIS — E785 Hyperlipidemia, unspecified: Secondary | ICD-10-CM | POA: Insufficient documentation

## 2013-09-05 DIAGNOSIS — R918 Other nonspecific abnormal finding of lung field: Secondary | ICD-10-CM

## 2013-09-05 DIAGNOSIS — I2510 Atherosclerotic heart disease of native coronary artery without angina pectoris: Secondary | ICD-10-CM | POA: Insufficient documentation

## 2013-09-05 DIAGNOSIS — J984 Other disorders of lung: Secondary | ICD-10-CM | POA: Insufficient documentation

## 2013-09-05 DIAGNOSIS — C3411 Malignant neoplasm of upper lobe, right bronchus or lung: Secondary | ICD-10-CM

## 2013-09-05 DIAGNOSIS — K219 Gastro-esophageal reflux disease without esophagitis: Secondary | ICD-10-CM | POA: Insufficient documentation

## 2013-09-05 DIAGNOSIS — Z79899 Other long term (current) drug therapy: Secondary | ICD-10-CM | POA: Insufficient documentation

## 2013-09-05 DIAGNOSIS — I10 Essential (primary) hypertension: Secondary | ICD-10-CM | POA: Insufficient documentation

## 2013-09-05 DIAGNOSIS — C3410 Malignant neoplasm of upper lobe, unspecified bronchus or lung: Secondary | ICD-10-CM | POA: Insufficient documentation

## 2013-09-05 DIAGNOSIS — Z87891 Personal history of nicotine dependence: Secondary | ICD-10-CM | POA: Insufficient documentation

## 2013-09-05 LAB — CBC
HCT: 35.5 % — ABNORMAL LOW (ref 36.0–46.0)
Hemoglobin: 11.7 g/dL — ABNORMAL LOW (ref 12.0–15.0)
MCH: 31.8 pg (ref 26.0–34.0)
MCHC: 33 g/dL (ref 30.0–36.0)
MCV: 96.5 fL (ref 78.0–100.0)
PLATELETS: 331 10*3/uL (ref 150–400)
RBC: 3.68 MIL/uL — ABNORMAL LOW (ref 3.87–5.11)
RDW: 14.6 % (ref 11.5–15.5)
WBC: 10.5 10*3/uL (ref 4.0–10.5)

## 2013-09-05 LAB — PROTIME-INR
INR: 0.97 (ref 0.00–1.49)
Prothrombin Time: 12.7 seconds (ref 11.6–15.2)

## 2013-09-05 LAB — APTT: aPTT: 28 seconds (ref 24–37)

## 2013-09-05 MED ORDER — FENTANYL CITRATE 0.05 MG/ML IJ SOLN
INTRAMUSCULAR | Status: AC
  Filled 2013-09-05: qty 2

## 2013-09-05 MED ORDER — SODIUM CHLORIDE 0.9 % IV SOLN
Freq: Once | INTRAVENOUS | Status: AC
  Administered 2013-09-05: 250 mL via INTRAVENOUS

## 2013-09-05 MED ORDER — MIDAZOLAM HCL 2 MG/2ML IJ SOLN
INTRAMUSCULAR | Status: AC
  Filled 2013-09-05: qty 2

## 2013-09-05 MED ORDER — FENTANYL CITRATE 0.05 MG/ML IJ SOLN
INTRAMUSCULAR | Status: AC | PRN
  Administered 2013-09-05: 25 ug via INTRAVENOUS

## 2013-09-05 MED ORDER — METOPROLOL TARTRATE 100 MG PO TABS: 100.0000 mg | ORAL_TABLET | Freq: Two times a day (BID) | ORAL | Status: AC

## 2013-09-05 MED ORDER — HYDROCODONE-ACETAMINOPHEN 5-325 MG PO TABS
1.0000 | ORAL_TABLET | ORAL | Status: DC | PRN
  Filled 2013-09-05: qty 2

## 2013-09-05 MED ORDER — MIDAZOLAM HCL 2 MG/2ML IJ SOLN
INTRAMUSCULAR | Status: AC | PRN
  Administered 2013-09-05: 0.5 mg via INTRAVENOUS

## 2013-09-05 NOTE — Discharge Instructions (Signed)
Needle Biopsy of Lung, Care After Refer to this sheet in the next few weeks. These instructions provide you with information on caring for yourself after your procedure. Your health care provider may also give you more specific instructions. Your treatment has been planned according to current medical practices, but problems sometimes occur. Call your health care provider if you have any problems or questions after your procedure. WHAT TO EXPECT AFTER THE PROCEDURE A bandage will be applied over the areas where the needle was inserted. You may be asked to apply pressure to the bandage for several minutes to ensure there is minimal bleeding. In most cases, you can leave when your needle biopsy procedure is completed. Do not drive yourself home. Someone else should take you home. If you received an IV sedative or general anesthetic, you will be taken to a comfortable place to relax while the medication wears off. If you have upcoming travel scheduled, talk to your doctor about when it is safe to travel by air after the procedure. HOME CARE INSTRUCTIONS Expect to take it easy for the rest of the day. Protect the area where you received the needle biopsy by keeping the bandage in place for as long as instructed. You may feel some mild pain or discomfort in the area, but this should stop in a day or two. Only take over-the-counter or prescription medicines for pain, discomfort, or fever as directed by your caregiver. SEEK MEDICAL CARE IF:   You have pain at the biopsy site that worsens or is not helped by medication.  You have swelling or drainage at the needle biopsy site.  You have a fever. SEEK IMMEDIATE MEDICAL CARE IF:   You have new or worsening shortness of breath.  You have chest pain.  You are coughing up blood.  You have bleeding that does not stop with pressure or a bandage.  You develop light-headedness or fainting. Document Released: 04/12/2007 Document Revised: 02/15/2013 Document  Reviewed: 11/07/2012 [facility] Patient Information 2014 [place]. Conscious Sedation, Adult, Care After Refer to this sheet in the next few weeks. These instructions provide you with information on caring for yourself after your procedure. Your health care provider may also give you more specific instructions. Your treatment has been planned according to current medical practices, but problems sometimes occur. Call your health care provider if you have any problems or questions after your procedure. WHAT TO EXPECT AFTER THE PROCEDURE  After your procedure:  You may feel sleepy, clumsy, and have poor balance for several hours.  Vomiting may occur if you eat too soon after the procedure. HOME CARE INSTRUCTIONS  Do not participate in any activities where you could become injured for at least 24 hours. Do not:  Drive.  Swim.  Ride a bicycle.  Operate heavy machinery.  Cook.  Use power tools.  Climb ladders.  Work from a high place.  Do not make important decisions or sign legal documents until you are improved.  If you vomit, drink water, juice, or soup when you can drink without vomiting. Make sure you have little or no nausea before eating solid foods.  Only take over-the-counter or prescription medicines for pain, discomfort, or fever as directed by your health care provider.  Make sure you and your family fully understand everything about the medicines given to you, including what side effects may occur.  You should not drink alcohol, take sleeping pills, or take medicines that cause drowsiness for at least 24 hours.  If you  smoke, do not smoke without supervision.  If you are feeling better, you may resume normal activities 24 hours after you were sedated.  Keep all appointments with your health care provider. SEEK MEDICAL CARE IF:  Your skin is pale or bluish in color.  You continue to feel nauseous or vomit.  Your pain is getting worse and is not helped by  medicine.  You have bleeding or swelling.  You are still sleepy or feeling clumsy after 24 hours. SEEK IMMEDIATE MEDICAL CARE IF:  You develop a rash.  You have difficulty breathing.  You develop any type of allergic problem.  You have a fever. MAKE SURE YOU:  Understand these instructions.  Will watch your condition.  Will get help right away if you are not doing well or get worse. Document Released: 04/05/2013 Document Reviewed: 01/20/2013 [facility] Patient Information 2014 [place], [place].

## 2013-09-05 NOTE — Procedures (Signed)
CT RUL lung core bx 18g x3 to surg path No complication No blood loss. See complete dictation in [facility].

## 2013-09-05 NOTE — H&P (Signed)
[name] is an 80 y.o. female.   Chief Complaint: "I'm having a biopsy " HPI: Patient with history of smoking, right shoulder pain, weight loss and recent CT revealing a right Pancoast tumor with associated hilar/rt paratracheal adenopathy presents today for CT guided biopsy of the RUL lung mass.                                                                                                                                                                                                                                    Past Medical History  Diagnosis Date  . Coronary artery disease 2000    Status post stent placement  . Hyperlipidemia   . Hypertension   . Cardiac tamponade   . Supraventricular tachycardia   . Abdominal aortic aneurysm     Repair Dr. Kellie Simmering 2006  . Dyspnea on exertion   . GERD (gastroesophageal reflux disease)   . Barrett's esophagus   . Pancoast tumor of right lung   . Descending thoracic aortic dissection 1995    Past Surgical History  Procedure Laterality Date  . Colonoscopy  01/10/2001    Normal  . Egd with biopsy  12/03/2005  . Abdominal aortic aneurysm repair  06/12/2005  . Abdominal aortogram    . Laminectomies      3  . Esophagogastroduodenoscopy  04/10/2009    Dr. Gala Romney short segment of salmon-colored epithelium consistent with prior short segment Barrett's (biopsy benign)  . Cholecystectomy  07/09/10    Chronic cholelithiasis Dr. Arnoldo Morale    Family History  Problem Relation Age of Onset  . Heart disease Mother   . Diabetes Mother   . Heart failure Father    Social History:  reports that she quit smoking about 20 years ago. Her smoking use included Cigarettes. She has a 40 pack-year smoking history. She does not have any smokeless tobacco history on file. She reports that she does not drink alcohol or use illicit drugs.  Allergies: No Known Allergies  Current outpatient prescriptions:acetaminophen (TYLENOL) 500 MG tablet, Take 1,000 mg by  mouth daily as needed for moderate pain., Disp: , Rfl: ;  aspirin EC 81 MG tablet, Take 81 mg by mouth every morning., Disp: , Rfl: ;  atorvastatin (LIPITOR) 10 MG tablet, Take 10 mg by mouth every evening., Disp: , Rfl:  HYDROcodone-acetaminophen (NORCO/VICODIN) 5-325 MG per tablet, Take 1 tablet by mouth every 6 (six) hours  as needed., Disp: 15 tablet, Rfl: 0;  levothyroxine (SYNTHROID, LEVOTHROID) 50 MCG tablet, Take 50 mcg by mouth daily before breakfast., Disp: , Rfl: ;  metoprolol (LOPRESSOR) 100 MG tablet, Take 100 mg by mouth 2 (two) times daily., Disp: , Rfl: ;  omeprazole (PRILOSEC OTC) 20 MG tablet, Take 20 mg by mouth daily., Disp: , Rfl:  quinapril (ACCUPRIL) 20 MG tablet, Take 20 mg by mouth every morning., Disp: , Rfl:  Current facility-administered medications:fentaNYL (SUBLIMAZE) 0.05 MG/ML injection, , , , ;  midazolam (VERSED) 2 MG/2ML injection, , , ,    Results for orders placed during the hospital encounter of 09/05/13 (from the past 48 hour(s))  APTT     Status: None   Collection Time    09/05/13  9:05 AM      Result Value Ref Range   aPTT 28  24 - 37 seconds  CBC     Status: Abnormal   Collection Time    09/05/13  9:05 AM      Result Value Ref Range   WBC 10.5  4.0 - 10.5 K/uL   RBC 3.68 (*) 3.87 - 5.11 MIL/uL   Hemoglobin 11.7 (*) 12.0 - 15.0 g/dL   HCT 35.5 (*) 36.0 - 46.0 %   MCV 96.5  78.0 - 100.0 fL   MCH 31.8  26.0 - 34.0 pg   MCHC 33.0  30.0 - 36.0 g/dL   RDW 14.6  11.5 - 15.5 %   Platelets 331  150 - 400 K/uL  PROTIME-INR     Status: None   Collection Time    09/05/13  9:05 AM      Result Value Ref Range   Prothrombin Time 12.7  11.6 - 15.2 seconds   INR 0.97  0.00 - 1.49   No results found.  Review of Systems  Constitutional: Positive for weight loss and malaise/fatigue. Negative for fever and chills.  Eyes: Positive for blurred vision.  Respiratory: Negative for hemoptysis.        Occ cough and mild dyspnea with exertion  Cardiovascular:  Negative for chest pain.  Gastrointestinal: Positive for abdominal pain. Negative for nausea and vomiting.  Genitourinary: Negative for hematuria.  Musculoskeletal: Positive for back pain.  Neurological: Positive for dizziness. Negative for headaches.       Occ balance difficulties( uses cane to assist with ambulation)    Blood pressure 133/67, pulse 64, temperature 97.7 F (36.5 C), temperature source Oral, resp. rate 16, height 5\' 3"  (1.6 m), weight 98 lb (44.453 kg), SpO2 93.00%. Physical Exam  Constitutional: She is oriented to person, place, and time.  Thin frail WF in NAD  Cardiovascular: Normal rate and regular rhythm.   Respiratory: Effort normal and breath sounds normal.  GI: Soft. Bowel sounds are normal.  Musculoskeletal: Normal range of motion. She exhibits no edema.  Neurological: She is alert and oriented to person, place, and time.     Assessment/Plan  Patient with history of smoking, right shoulder pain, weight loss and recent CT revealing a right Pancoast tumor with associated hilar/rt paratracheal adenopathy presents today for CT guided biopsy of the RUL lung mass.   Details/risks of procedure d/w pt/family with their understanding and consent.  ALLRED,D KEVIN 09/05/2013, 10:35 AM

## 2013-09-08 ENCOUNTER — Ambulatory Visit (HOSPITAL_COMMUNITY)
Admission: RE | Admit: 2013-09-08 | Discharge: 2013-09-08 | Disposition: A | Discharge status: Home / Self Care | Payer: Medicare Other | Source: Ambulatory Visit | Attending: Thoracic Surgery (Cardiothoracic Vascular Surgery) | Admitting: Thoracic Surgery (Cardiothoracic Vascular Surgery)

## 2013-09-08 DIAGNOSIS — J438 Other emphysema: Secondary | ICD-10-CM | POA: Insufficient documentation

## 2013-09-08 DIAGNOSIS — N269 Renal sclerosis, unspecified: Secondary | ICD-10-CM | POA: Insufficient documentation

## 2013-09-08 DIAGNOSIS — C7951 Secondary malignant neoplasm of bone: Secondary | ICD-10-CM | POA: Insufficient documentation

## 2013-09-08 DIAGNOSIS — C7952 Secondary malignant neoplasm of bone marrow: Secondary | ICD-10-CM

## 2013-09-08 DIAGNOSIS — R918 Other nonspecific abnormal finding of lung field: Secondary | ICD-10-CM

## 2013-09-08 DIAGNOSIS — S22009A Unspecified fracture of unspecified thoracic vertebra, initial encounter for closed fracture: Secondary | ICD-10-CM | POA: Insufficient documentation

## 2013-09-08 DIAGNOSIS — X58XXXA Exposure to other specified factors, initial encounter: Secondary | ICD-10-CM | POA: Insufficient documentation

## 2013-09-08 DIAGNOSIS — I2510 Atherosclerotic heart disease of native coronary artery without angina pectoris: Secondary | ICD-10-CM | POA: Insufficient documentation

## 2013-09-08 DIAGNOSIS — C3410 Malignant neoplasm of upper lobe, unspecified bronchus or lung: Secondary | ICD-10-CM | POA: Insufficient documentation

## 2013-09-08 DIAGNOSIS — I700 Atherosclerosis of aorta: Secondary | ICD-10-CM | POA: Insufficient documentation

## 2013-09-08 LAB — GLUCOSE, CAPILLARY: Glucose-Capillary: 105 mg/dL — ABNORMAL HIGH (ref 70–99)

## 2013-09-08 MED ORDER — FLUDEOXYGLUCOSE F - 18 (FDG) INJECTION
6.2000 | Freq: Once | INTRAVENOUS | Status: AC | PRN
  Administered 2013-09-08: 6.2 via INTRAVENOUS

## 2013-09-11 ENCOUNTER — Ambulatory Visit (HOSPITAL_COMMUNITY)
Admission: RE | Admit: 2013-09-11 | Discharge: 2013-09-11 | Disposition: A | Discharge status: Home / Self Care | Payer: Medicare Other | Source: Ambulatory Visit | Attending: Thoracic Surgery (Cardiothoracic Vascular Surgery) | Admitting: Thoracic Surgery (Cardiothoracic Vascular Surgery)

## 2013-09-11 DIAGNOSIS — R918 Other nonspecific abnormal finding of lung field: Secondary | ICD-10-CM

## 2013-09-11 DIAGNOSIS — I6789 Other cerebrovascular disease: Secondary | ICD-10-CM | POA: Insufficient documentation

## 2013-09-11 DIAGNOSIS — R222 Localized swelling, mass and lump, trunk: Secondary | ICD-10-CM | POA: Insufficient documentation

## 2013-09-11 MED ORDER — GADOBENATE DIMEGLUMINE 529 MG/ML IV SOLN
9.0000 mL | Freq: Once | INTRAVENOUS | Status: AC | PRN
  Administered 2013-09-11: 9 mL via INTRAVENOUS

## 2013-09-14 ENCOUNTER — Encounter: Payer: Self-pay | Admitting: Thoracic Surgery (Cardiothoracic Vascular Surgery)

## 2013-09-14 ENCOUNTER — Ambulatory Visit (INDEPENDENT_AMBULATORY_CARE_PROVIDER_SITE_OTHER): Payer: Medicare Other | Admitting: Thoracic Surgery (Cardiothoracic Vascular Surgery)

## 2013-09-14 VITALS — BP 154/80 | HR 93 | Resp 20 | Ht 63.0 in | Wt 98.0 lb

## 2013-09-14 DIAGNOSIS — R918 Other nonspecific abnormal finding of lung field: Secondary | ICD-10-CM

## 2013-09-14 DIAGNOSIS — R599 Enlarged lymph nodes, unspecified: Secondary | ICD-10-CM

## 2013-09-14 DIAGNOSIS — C3411 Malignant neoplasm of upper lobe, right bronchus or lung: Secondary | ICD-10-CM

## 2013-09-14 DIAGNOSIS — R222 Localized swelling, mass and lump, trunk: Secondary | ICD-10-CM

## 2013-09-14 DIAGNOSIS — I71019 Dissection of thoracic aorta, unspecified: Secondary | ICD-10-CM

## 2013-09-14 DIAGNOSIS — I7101 Dissection of thoracic aorta: Secondary | ICD-10-CM

## 2013-09-14 DIAGNOSIS — R591 Generalized enlarged lymph nodes: Secondary | ICD-10-CM

## 2013-09-14 DIAGNOSIS — I71012 Dissection of descending thoracic aorta: Secondary | ICD-10-CM

## 2013-09-14 DIAGNOSIS — C3410 Malignant neoplasm of upper lobe, unspecified bronchus or lung: Secondary | ICD-10-CM

## 2013-09-14 NOTE — Progress Notes (Signed)
HPI:  [name] returns today to discuss results of her workup.  [name] is an 80 yo woman with multiple medical problems including a 40 pack year history of tobacco abuse. She has been having right shoulder pain for the past "couple of months." About 2 weeks ago she fell trying to sit on a stool and landed on her right shoulder. She had an x-ray done which showed a right apical lung mass. She then had a CT of the chest which showed a 3.4 x 4.5 x 3.9 cm Pancoast tumor in the right superior sulcus with invasion of the first rib. There was hilar and right paratracheal adenopathy on the right as well.   She has lost 22 pounds in the past 3 months. She has a history of a cough, but says it is less frequent since January. She denies hemoptysis. She also c/o blurry vision which is new over the past week.  I saw her in the office 2 weeks ago and recommended that we do a needle biopsy, a PET/CT, and an MRI of the brain.  She continues to complain of lightheadedness, but says he's not having dizziness. She doesn't have any focal neurologic problems. She continues to have right shoulder pain.   Current Outpatient Prescriptions  Medication Sig Dispense Refill  . acetaminophen (TYLENOL) 500 MG tablet Take 1,000 mg by mouth daily as needed for moderate pain.      Marland Kitchen aspirin EC 81 MG tablet Take 81 mg by mouth every morning.      Marland Kitchen atorvastatin (LIPITOR) 10 MG tablet Take 10 mg by mouth every evening.      Marland Kitchen HYDROcodone-acetaminophen (NORCO/VICODIN) 5-325 MG per tablet Take 1 tablet by mouth every 6 (six) hours as needed.  15 tablet  0  . levothyroxine (SYNTHROID, LEVOTHROID) 50 MCG tablet Take 50 mcg by mouth daily before breakfast.      . metoprolol (LOPRESSOR) 100 MG tablet Take 1 tablet (100 mg total) by mouth 2 (two) times daily.  60 tablet  0  . omeprazole (PRILOSEC OTC) 20 MG tablet Take 20 mg by mouth daily.      . quinapril (ACCUPRIL) 20 MG tablet Take 20 mg by mouth every morning.       No  current facility-administered medications for this visit.    Physical Exam BP 154/80  Pulse 93  Resp 20  Ht 5\' 3"  (1.6 m)  Wt 98 lb (44.453 kg)  BMI 17.36 kg/m2  SpO2 95% Frail-appearing 80 year old woman in no acute distress Exam unchanged  Diagnostic Tests: FINAL DIAGNOSIS Diagnosis Lung, needle/core biopsy(ies), RUL - INVASIVE MODERATELY DIFFERENTIATED SQUAMOUS CELL CARCINOMA. Aldona Bar MD Pathologist, Electronic Signature (Case signed 09/06/2013) Specimen Gross and Clinical Information Specimen(s) Obtained: Lung, needle/core biopsy(ies), RUL Specimen Clinical  NUCLEAR MEDICINE PET SKULL BASE TO THIGH  TECHNIQUE:  6.2 mCi F-18 FDG was injected intravenously. Full-ring PET imaging  was performed from the skull base to thigh after the radiotracer. CT  data was obtained and used for attenuation correction and anatomic  localization.  FASTING BLOOD GLUCOSE: Value: 105 mg/dl  COMPARISON: Chest CT 06/17/2014.  FINDINGS:  NECK  No hypermetabolic lymph nodes in the neck.  CHEST  Large mass in the anterior aspect of the right upper lobe is again  noted, measuring approximately 4.0 x 7.5 cm on today's examination.  This mass is diffusely hypermetabolic (SUVmax = 44.0), and  demonstrates increasing invasion of the lateral aspect of the right  first rib when compared  to the prior study. This mass is also a  intimately associated with the undersurface of the right subclavian  vein and innominate vein, suggesting some degree of mediastinal  invasion. Mildly hypermetabolic discretion hypermetabolic right  paratracheal lymph node measuring 1.4 cm in short (SUVmax = 10.3).  No other definite hypermetabolic mediastinal or hilar  lymphadenopathy. Severe centrilobular emphysema, most pronounced  throughout the lung apices bilaterally. No acute consolidative  airspace disease. No pleural effusions. Heart size is normal. There  is no significant pericardial fluid, thickening or  pericardial  calcification. There is atherosclerosis of the thoracic aorta, the  great vessels of the mediastinum and the coronary arteries,  including calcified atherosclerotic plaque in the left main, left  anterior descending, left circumflex and right coronary arteries. In  addition, there is mild aneurysmal dilatation of the isthmus of the  thoracic aorta (4.2 cm in diameter).  ABDOMEN/PELVIS  No abnormal hypermetabolic activity within the liver, pancreas,  adrenal glands, or spleen. No hypermetabolic lymph nodes in the  abdomen or pelvis. Status post cholecystectomy. Right renal atrophy.  1.4 cm low-attenuation lesion extending exophytically off the lower  pole of the atrophied right kidney demonstrates no internal  hypermetabolism, and although incompletely characterized on the  noncontrast CT examination, strongly favored to represent a cyst.  Extensive atherosclerosis throughout the abdominal and pelvic  vasculature, including postoperative changes of aortobifemoral  bypass graft placement. No significant volume of ascites. No  pneumoperitoneum. No pathologic distention of small bowel. Uterus  and ovaries are atrophic, but otherwise unremarkable in appearance.  SKELETON  Mild sclerosis throughout the T8 vertebral body with compression  fracture an approximately 30% loss of anterior vertebral body  height. This does demonstrate some low-level hypermetabolism (SUVmax  = 2.9) diffusely throughout the vertebral body, favored to represent  normal metabolic activity from healing. No other aggressive  appearing hypermetabolic bony lesions.  IMPRESSION:  1. 4.0 x 7.5 cm hypermetabolic mass in the anterior aspect of the  right upper lobe with evidence of chest wall invasion, and probable  early mediastinal invasion, as well as an enlarged hypermetabolic  right paratracheal lymph node. No definite evidence of distal  metastatic disease in the neck, abdomen or pelvis. These findings   suggest T4, N2, Mx disease (i.e., stage IIIB disease).  2. There is also low level all hypermetabolism within the T8  vertebral body associated with a compression fracture. This has a  relatively benign appearance, with the metabolic activity favored to  be related to normal bony healing.  3. Atherosclerosis, including left main and 3 vessel coronary artery  disease. In addition, there is mild aneurysmal dilatation of the  isthmus of the thoracic aorta (4.2 cm in diameter).  4. Additional incidental findings, as above.  Electronically Signed  By: Vinnie Langton M.D.  On: 09/08/2013 14:41    MRI HEAD WITHOUT AND WITH CONTRAST  TECHNIQUE:  Multiplanar, multiecho pulse sequences of the brain and surrounding  structures were obtained without and with intravenous contrast.  CONTRAST: 50mL MULTIHANCE GADOBENATE DIMEGLUMINE 529 MG/ML IV SOLN  COMPARISON: None.  FINDINGS:  Major intracranial vascular flow voids are preserved, dominant  distal right vertebral artery. No restricted diffusion or evidence  of acute infarction. No ventriculomegaly. T2 heterogeneity  throughout the deep gray matter nuclei in part related to  perivascular spaces. Chronic lacunar infarcts in the cerebellum  (maximal left inferior cerebellar hemisphere). Moderate patchy and  confluent pons and cerebral white matter T2 and FLAIR  hyperintensity. No cortical encephalomalacia identified.  Visible  internal auditory structures appear normal. Visualized orbit soft  tissues are within normal limits.  No abnormal enhancement identified. Negative visualized cervical  spine. Normal bone marrow signal.  No midline shift, mass effect, ventriculomegaly, extra-axial  collection or acute intracranial hemorrhage. Cervicomedullary  junction and pituitary are within normal limits. Visualized scalp  soft tissues are within normal limits.  IMPRESSION:  1. No acute or metastatic intracranial abnormality.  2. Chronic small vessel  disease.  Electronically Signed  By: Lars Pinks M.D.  On: 09/11/2013 16:06   Impression: I had a long discussion with Mrs. Jr, her husband and 3 of her sons. We discussed the pathology results and the PET CT results. She has a T4, N2, stage IIIB squamous cell carcinoma(Pancoast tumor). We also discussed that the MR brain showed no evidence of brain metastases.  The treatment of choice would be chemotherapy and radiation. Given her N2 nodal involvement and advanced age I do not think she will be a candidate for staged surgical treatment. Family had a lot of questions about the conduct and outcomes of that chemotherapy and radiation therapy. I answered as best I could, but deferred most of those questions to oncology and radiation oncology can do a better job than I.  She and the family strongly wished to have treatment at [facility], as long as she is getting the best treatment available. I am quite comfortable she will receive excellent care at [facility]. They specifically requested Dr. Arloa Koh for radiation oncology. We will also get her set up to see either Dr. Tressie Stalker or Dr. Jeralyn Bennett from oncology. We will see about getting her as quickly as possible so that she can get started with treatment.  Plan: Oncology and Radiation Oncology evaluations  I will be happy to see her back at any time if I can be of any further assistance with her care  I spent over 15 minutes in counseling the patient and family

## 2013-09-19 ENCOUNTER — Other Ambulatory Visit: Payer: Self-pay

## 2013-09-19 DIAGNOSIS — C3490 Malignant neoplasm of unspecified part of unspecified bronchus or lung: Secondary | ICD-10-CM

## 2013-09-21 ENCOUNTER — Encounter (HOSPITAL_COMMUNITY): Payer: Self-pay | Admitting: Pharmacy Technician

## 2013-09-22 ENCOUNTER — Encounter (HOSPITAL_COMMUNITY)
Admission: RE | Admit: 2013-09-22 | Discharge: 2013-09-22 | Disposition: A | Discharge status: Home / Self Care | Payer: Medicare Other | Source: Ambulatory Visit | Attending: Thoracic Surgery (Cardiothoracic Vascular Surgery) | Admitting: Thoracic Surgery (Cardiothoracic Vascular Surgery)

## 2013-09-22 ENCOUNTER — Encounter (HOSPITAL_COMMUNITY): Payer: Self-pay

## 2013-09-22 VITALS — BP 102/65 | HR 65 | Temp 97.6°F | Resp 16 | Ht 63.0 in | Wt 94.4 lb

## 2013-09-22 DIAGNOSIS — C3490 Malignant neoplasm of unspecified part of unspecified bronchus or lung: Secondary | ICD-10-CM

## 2013-09-22 HISTORY — DX: Personal history of urinary calculi: Z87.442

## 2013-09-22 HISTORY — DX: Disorder of kidney and ureter, unspecified: N28.9

## 2013-09-22 HISTORY — DX: Hypothyroidism, unspecified: E03.9

## 2013-09-22 LAB — PROTIME-INR
INR: 0.96 (ref 0.00–1.49)
Prothrombin Time: 12.6 seconds (ref 11.6–15.2)

## 2013-09-22 LAB — COMPREHENSIVE METABOLIC PANEL
ALK PHOS: 82 U/L (ref 39–117)
ALT: 9 U/L (ref 0–35)
AST: 15 U/L (ref 0–37)
Albumin: 2.9 g/dL — ABNORMAL LOW (ref 3.5–5.2)
BUN: 15 mg/dL (ref 6–23)
CO2: 25 mEq/L (ref 19–32)
Calcium: 10.1 mg/dL (ref 8.4–10.5)
Chloride: 90 mEq/L — ABNORMAL LOW (ref 96–112)
Creatinine, Ser: 0.68 mg/dL (ref 0.50–1.10)
GFR calc Af Amer: >90 mL/min (ref >90)
GFR calc non Af Amer: 79 mL/min — ABNORMAL LOW (ref >90)
GLUCOSE: 132 mg/dL — AB (ref 70–99)
POTASSIUM: 4.6 meq/L (ref 3.7–5.3)
SODIUM: 129 meq/L — AB (ref 137–147)
TOTAL PROTEIN: 7.1 g/dL (ref 6.0–8.3)
Total Bilirubin: 0.5 mg/dL (ref 0.3–1.2)

## 2013-09-22 LAB — CBC
HCT: 38.4 % (ref 36.0–46.0)
HEMOGLOBIN: 13.1 g/dL (ref 12.0–15.0)
MCH: 32.7 pg (ref 26.0–34.0)
MCHC: 34.1 g/dL (ref 30.0–36.0)
MCV: 95.8 fL (ref 78.0–100.0)
Platelets: 342 10*3/uL (ref 150–400)
RBC: 4.01 MIL/uL (ref 3.87–5.11)
RDW: 14.3 % (ref 11.5–15.5)
WBC: 13.2 10*3/uL — AB (ref 4.0–10.5)

## 2013-09-22 LAB — APTT: APTT: 37 s (ref 24–37)

## 2013-09-22 NOTE — Pre-Procedure Instructions (Addendum)
[name]  09/22/2013   Your procedure is scheduled on:  Monday, March 30  Report to [facility] Main Entrance "A" at 7:30 AM.  Call this number if you have problems the morning of surgery: [phone]   Remember:   Do not eat food or drink liquids after midnight.   Take these medicines the morning of surgery with A SIP OF WATER: tylenol or hydrocodone  if needed,  Synthroid, metoprolol, prilosec        STOP all herbel meds, nsaids (aleve,naproxen,advil,ibuprofen) now including vitamins   Do not wear jewelry, make-up or nail polish.  Do not wear lotions, powders, or perfumes. You may wear deodorant.  Do not shave 48 hours prior to surgery. Men may shave face and neck.  Do not bring valuables to the hospital.  [facility] is not responsible  for any belongings or valuables.               Contacts, dentures or bridgework may not be worn into surgery.  Leave suitcase in the car. After surgery it may be brought to your room.  For patients admitted to the hospital, discharge time is determined by your                treatment team.               Patients discharged the day of surgery will not be allowed to drive  home.  Name and phone number of your driver:    Special Instructions  Special Instructions: [place] - Preparing for Surgery  Before surgery, you can play an important role.  Because skin is not sterile, your skin needs to be as free of germs as possible.  You can reduce the number of germs on you skin by washing with CHG (chlorahexidine gluconate) soap before surgery.  CHG is an antiseptic cleaner which kills germs and bonds with the skin to continue killing germs even after washing.  Please DO NOT use if you have an allergy to CHG or antibacterial soaps.  If your skin becomes reddened/irritated stop using the CHG and inform your nurse when you arrive at Short Stay.  Do not shave (including legs and underarms) for at least 48 hours prior to the first CHG shower.   You may shave your face.  Please follow these instructions carefully:   1.  Shower with CHG Soap the night before surgery and the morning of Surgery.  2.  If you choose to wash your hair, wash your hair first as usual with your normal shampoo.  3.  After you shampoo, rinse your hair and body thoroughly to remove the Shampoo.  4.  Use CHG as you would any other liquid soap.  You can apply chg directly  to the skin and wash gently with scrungie or a clean washcloth.  5.  Apply the CHG Soap to your body ONLY FROM THE NECK DOWN.  Do not use on open wounds or open sores.  Avoid contact with your eyes ears, mouth and genitals (private parts).  Wash genitals (private parts)       with your normal soap.  6.  Wash thoroughly, paying special attention to the area where your surgery will be performed.  7.  Thoroughly rinse your body with warm water from the neck down.  8.  DO NOT shower/wash with your normal soap after using and rinsing off the CHG Soap.  9.  Pat yourself dry with a clean towel.  10.  Wear clean pajamas.            11.  Place clean sheets on your bed the night of your first shower and do not sleep with pets.  Day of Surgery  Do not apply any lotions/deodorants the morning of surgery.  Please wear clean clothes to the hospital/surgery center.   Please read over the following fact sheets that you were given: Pain Booklet, Coughing and Deep Breathing and Surgical Site Infection Prevention

## 2013-09-22 NOTE — Progress Notes (Signed)
Redraw cmet dos per dr hodierne due to low results of sodium and chloride preadmit day

## 2013-09-24 MED ORDER — DEXTROSE 5 % IV SOLN
1.5000 g | INTRAVENOUS | Status: AC
  Administered 2013-09-25: 1.5 g via INTRAVENOUS
  Filled 2013-09-24: qty 1.5

## 2013-09-25 ENCOUNTER — Encounter (HOSPITAL_COMMUNITY): Payer: Medicare Other | Admitting: Anesthesiology

## 2013-09-25 ENCOUNTER — Encounter (HOSPITAL_COMMUNITY)
Admission: RE | Disposition: A | Discharge status: Home / Self Care | Payer: Self-pay | Source: Ambulatory Visit | Attending: Thoracic Surgery (Cardiothoracic Vascular Surgery)

## 2013-09-25 ENCOUNTER — Ambulatory Visit (HOSPITAL_COMMUNITY): Payer: Medicare Other | Admitting: Anesthesiology

## 2013-09-25 ENCOUNTER — Ambulatory Visit (HOSPITAL_COMMUNITY): Payer: Medicare Other

## 2013-09-25 ENCOUNTER — Ambulatory Visit (HOSPITAL_COMMUNITY)
Admission: RE | Admit: 2013-09-25 | Discharge: 2013-09-25 | Disposition: A | Discharge status: Home / Self Care | Payer: Medicare Other | Source: Ambulatory Visit | Attending: Thoracic Surgery (Cardiothoracic Vascular Surgery) | Admitting: Thoracic Surgery (Cardiothoracic Vascular Surgery)

## 2013-09-25 ENCOUNTER — Encounter (HOSPITAL_COMMUNITY): Payer: Self-pay | Admitting: Certified Registered Nurse Anesthetist

## 2013-09-25 DIAGNOSIS — C3490 Malignant neoplasm of unspecified part of unspecified bronchus or lung: Secondary | ICD-10-CM

## 2013-09-25 DIAGNOSIS — I2510 Atherosclerotic heart disease of native coronary artery without angina pectoris: Secondary | ICD-10-CM | POA: Insufficient documentation

## 2013-09-25 DIAGNOSIS — I10 Essential (primary) hypertension: Secondary | ICD-10-CM | POA: Insufficient documentation

## 2013-09-25 DIAGNOSIS — Z87891 Personal history of nicotine dependence: Secondary | ICD-10-CM | POA: Insufficient documentation

## 2013-09-25 DIAGNOSIS — K219 Gastro-esophageal reflux disease without esophagitis: Secondary | ICD-10-CM | POA: Insufficient documentation

## 2013-09-25 DIAGNOSIS — Z7982 Long term (current) use of aspirin: Secondary | ICD-10-CM | POA: Insufficient documentation

## 2013-09-25 DIAGNOSIS — C3410 Malignant neoplasm of upper lobe, unspecified bronchus or lung: Secondary | ICD-10-CM | POA: Insufficient documentation

## 2013-09-25 DIAGNOSIS — I498 Other specified cardiac arrhythmias: Secondary | ICD-10-CM | POA: Insufficient documentation

## 2013-09-25 DIAGNOSIS — K2270 Barrett's esophagus without dysplasia: Secondary | ICD-10-CM | POA: Insufficient documentation

## 2013-09-25 DIAGNOSIS — R599 Enlarged lymph nodes, unspecified: Secondary | ICD-10-CM | POA: Insufficient documentation

## 2013-09-25 DIAGNOSIS — E785 Hyperlipidemia, unspecified: Secondary | ICD-10-CM | POA: Insufficient documentation

## 2013-09-25 DIAGNOSIS — Z9861 Coronary angioplasty status: Secondary | ICD-10-CM | POA: Insufficient documentation

## 2013-09-25 DIAGNOSIS — H538 Other visual disturbances: Secondary | ICD-10-CM | POA: Insufficient documentation

## 2013-09-25 DIAGNOSIS — E039 Hypothyroidism, unspecified: Secondary | ICD-10-CM | POA: Insufficient documentation

## 2013-09-25 HISTORY — PX: PORTACATH PLACEMENT: SHX2246

## 2013-09-25 LAB — COMPREHENSIVE METABOLIC PANEL
ALT: 10 U/L (ref 0–35)
AST: 16 U/L (ref 0–37)
Albumin: 2.7 g/dL — ABNORMAL LOW (ref 3.5–5.2)
Alkaline Phosphatase: 80 U/L (ref 39–117)
BUN: 15 mg/dL (ref 6–23)
CHLORIDE: 94 meq/L — AB (ref 96–112)
CO2: 23 mEq/L (ref 19–32)
Calcium: 10 mg/dL (ref 8.4–10.5)
Creatinine, Ser: 0.64 mg/dL (ref 0.50–1.10)
GFR, EST NON AFRICAN AMERICAN: 81 mL/min — AB (ref >90)
GLUCOSE: 118 mg/dL — AB (ref 70–99)
Potassium: 5.1 mEq/L (ref 3.7–5.3)
Sodium: 131 mEq/L — ABNORMAL LOW (ref 137–147)
Total Bilirubin: 0.4 mg/dL (ref 0.3–1.2)
Total Protein: 6.8 g/dL (ref 6.0–8.3)

## 2013-09-25 SURGERY — INSERTION, TUNNELED CENTRAL VENOUS DEVICE, WITH PORT
Anesthesia: General | Site: Chest | Laterality: Left

## 2013-09-25 MED ORDER — PHENYLEPHRINE HCL 10 MG/ML IJ SOLN
INTRAMUSCULAR | Status: DC | PRN
Start: 2013-09-25 — End: 2013-09-25
  Administered 2013-09-25 (×5): 40 ug via INTRAVENOUS

## 2013-09-25 MED ORDER — HEPARIN SODIUM (PORCINE) 1000 UNIT/ML IJ SOLN
INTRAMUSCULAR | Status: AC
  Filled 2013-09-25: qty 1

## 2013-09-25 MED ORDER — SODIUM CHLORIDE 0.9 % IR SOLN
Status: DC | PRN
  Administered 2013-09-25: 12:00:00

## 2013-09-25 MED ORDER — PROPOFOL 10 MG/ML IV BOLUS
INTRAVENOUS | Status: AC
  Filled 2013-09-25: qty 20

## 2013-09-25 MED ORDER — PROPOFOL INFUSION 10 MG/ML OPTIME
INTRAVENOUS | Status: DC | PRN
  Administered 2013-09-25: 100 ug/kg/min via INTRAVENOUS

## 2013-09-25 MED ORDER — HEPARIN SOD (PORK) LOCK FLUSH 100 UNIT/ML IV SOLN
INTRAVENOUS | Status: AC
  Filled 2013-09-25: qty 5

## 2013-09-25 MED ORDER — FENTANYL CITRATE 0.05 MG/ML IJ SOLN
INTRAMUSCULAR | Status: AC
  Filled 2013-09-25: qty 5

## 2013-09-25 MED ORDER — MIDAZOLAM HCL 2 MG/2ML IJ SOLN
INTRAMUSCULAR | Status: AC
  Filled 2013-09-25: qty 2

## 2013-09-25 MED ORDER — ONDANSETRON HCL 4 MG/2ML IJ SOLN
INTRAMUSCULAR | Status: DC | PRN
  Administered 2013-09-25: 4 mg via INTRAVENOUS

## 2013-09-25 MED ORDER — FENTANYL CITRATE 0.05 MG/ML IJ SOLN
INTRAMUSCULAR | Status: DC | PRN
  Administered 2013-09-25 (×3): 25 ug via INTRAVENOUS

## 2013-09-25 MED ORDER — LIDOCAINE HCL (PF) 1 % IJ SOLN
INTRAMUSCULAR | Status: AC
  Filled 2013-09-25: qty 30

## 2013-09-25 MED ORDER — LACTATED RINGERS IV SOLN
INTRAVENOUS | Status: DC | PRN
Start: 2013-09-25 — End: 2013-09-25
  Administered 2013-09-25 (×2): via INTRAVENOUS

## 2013-09-25 MED ORDER — HEPARIN SODIUM (PORCINE) 1000 UNIT/ML IJ SOLN
INTRAMUSCULAR | Status: DC | PRN
  Administered 2013-09-25: 2000 [IU]

## 2013-09-25 MED ORDER — FENTANYL CITRATE 0.05 MG/ML IJ SOLN: 25.0000 ug | INTRAMUSCULAR | Status: DC | PRN

## 2013-09-25 MED ORDER — LACTATED RINGERS IV SOLN
INTRAVENOUS | Status: DC
  Administered 2013-09-25: 10:00:00 via INTRAVENOUS

## 2013-09-25 MED ORDER — LIDOCAINE HCL 1 % IJ SOLN
INTRAMUSCULAR | Status: DC | PRN
  Administered 2013-09-25: 10 mL

## 2013-09-25 SURGICAL SUPPLY — 40 items
ADH SKN CLS APL DERMABOND .7 (GAUZE/BANDAGES/DRESSINGS) ×1
ADH SKN CLS LQ APL DERMABOND (GAUZE/BANDAGES/DRESSINGS) ×1
BAG DECANTER FOR FLEXI CONT (MISCELLANEOUS) ×3 IMPLANT
CANISTER SUCTION 2500CC (MISCELLANEOUS) ×3 IMPLANT
COVER SURGICAL LIGHT HANDLE (MISCELLANEOUS) ×6 IMPLANT
DERMABOND ADHESIVE PROPEN (GAUZE/BANDAGES/DRESSINGS) ×2
DERMABOND ADVANCED (GAUZE/BANDAGES/DRESSINGS) ×2
DERMABOND ADVANCED .7 DNX12 (GAUZE/BANDAGES/DRESSINGS) ×1 IMPLANT
DERMABOND ADVANCED .7 DNX6 (GAUZE/BANDAGES/DRESSINGS) IMPLANT
DRAPE C-ARM 42X72 X-RAY (DRAPES) ×3 IMPLANT
DRAPE CHEST BREAST 15X10 FENES (DRAPES) ×3 IMPLANT
ELECT REM PT RETURN 9FT ADLT (ELECTROSURGICAL) ×3
ELECTRODE REM PT RTRN 9FT ADLT (ELECTROSURGICAL) ×1 IMPLANT
GLOVE SURG SIGNA 7.5 PF LTX (GLOVE) ×3 IMPLANT
GOWN STRL REUS W/ TWL LRG LVL3 (GOWN DISPOSABLE) ×1 IMPLANT
GOWN STRL REUS W/ TWL XL LVL3 (GOWN DISPOSABLE) ×1 IMPLANT
GOWN STRL REUS W/TWL LRG LVL3 (GOWN DISPOSABLE) ×3
GOWN STRL REUS W/TWL XL LVL3 (GOWN DISPOSABLE) ×3
GUIDEWIRE UNCOATED ST S 7038 (WIRE) IMPLANT
INTRODUCER 13FR (MISCELLANEOUS) IMPLANT
INTRODUCER COOK 11FR (CATHETERS) IMPLANT
KIT BASIN OR (CUSTOM PROCEDURE TRAY) ×3 IMPLANT
KIT PORT POWER 8FR ISP CVUE (Catheter) ×2 IMPLANT
KIT PORT POWER 9.6FR MRI PREA (Catheter) IMPLANT
KIT PORT POWER ISP 8FR (Catheter) IMPLANT
KIT POWER CATH 8FR (Catheter) IMPLANT
KIT ROOM TURNOVER OR (KITS) ×3 IMPLANT
NEEDLE 22X1 1/2 (OR ONLY) (NEEDLE) ×3 IMPLANT
NS IRRIG 1000ML POUR BTL (IV SOLUTION) ×3 IMPLANT
PACK GENERAL/GYN (CUSTOM PROCEDURE TRAY) ×3 IMPLANT
PAD ARMBOARD 7.5X6 YLW CONV (MISCELLANEOUS) ×6 IMPLANT
SET SHEATH INTRODUCER 10FR (MISCELLANEOUS) IMPLANT
SUT MNCRL AB 4-0 PS2 18 (SUTURE) ×3 IMPLANT
SUT VIC AB 3-0 SH 27 (SUTURE) ×6
SUT VIC AB 3-0 SH 27X BRD (SUTURE) ×1 IMPLANT
SYR 20CC LL (SYRINGE) ×3 IMPLANT
SYR CONTROL 10ML LL (SYRINGE) ×3 IMPLANT
TOWEL OR 17X24 6PK STRL BLUE (TOWEL DISPOSABLE) ×3 IMPLANT
TOWEL OR 17X26 10 PK STRL BLUE (TOWEL DISPOSABLE) ×3 IMPLANT
WATER STERILE IRR 1000ML POUR (IV SOLUTION) ×3 IMPLANT

## 2013-09-25 NOTE — Progress Notes (Signed)
Dr. Roxan Hockey updated on current vital signs, ekg results, interventions per Dr. Oletta Lamas. Plan to continue to discharge patient to home.

## 2013-09-25 NOTE — H&P (View-Only) (Signed)
PCP is Leonides Grills, MD Referring Provider is Elsie Lincoln, MD  Chief Complaint  Patient presents with  . Lung Mass    right upper lobe per CT CHEST WITH CONTRAST @ [facility]  . Adenopathy    HPI: 80 yo woman with a past history of smoking presents with right shoulder pain and weight loss.  [name] is an 80 yo woman with multiple medical problems including a 40 pack year history of tobacco abuse. She has been having right shoulder pain for the past "couple of months." About 2 weeks ago she fell trying to sit on a stool and landed on her right shoulder. She had an x-ray done which showed a right apical lung mass. She then had a CT of the chest which showed a 3.4 x 4.5 x 3.9 cm Pancoast tumor in the right superior sulcus with invasion of the first rib. There was hilar and right paratracheal adenopathy on the right as well.   She has lost 22 pounds in the past 3 months. She has a history of a cough, but says it is less frequent since January. She denies hemoptysis. She also c/o blurry vision which is new over the past week.  ECOG/ ZUBROD = 1   Past Medical History  Diagnosis Date  . Coronary artery disease 2000    Status post stent placement  . Hyperlipidemia   . Hypertension   . Cardiac tamponade   . Supraventricular tachycardia   . Abdominal aortic aneurysm     Repair Dr. Kellie Simmering 2006  . Dyspnea on exertion   . GERD (gastroesophageal reflux disease)   . Barrett's esophagus   . Pancoast tumor of right lung   . Descending thoracic aortic dissection 1995    Past Surgical History  Procedure Laterality Date  . Colonoscopy  01/10/2001    Normal  . Egd with biopsy  12/03/2005  . Abdominal aortic aneurysm repair  06/12/2005  . Abdominal aortogram    . Laminectomies      3  . Esophagogastroduodenoscopy  04/10/2009    Dr. Gala Romney short segment of salmon-colored epithelium consistent with prior short segment Barrett's (biopsy benign)  . Cholecystectomy  07/09/10    Chronic  cholelithiasis Dr. Arnoldo Morale    Family History  Problem Relation Age of Onset  . Heart disease Mother   . Diabetes Mother   . Heart failure Father     Social History History  Substance Use Topics  . Smoking status: Former Smoker -- 1.00 packs/day for 40 years    Types: Cigarettes    Quit date: 06/29/1993  . Smokeless tobacco: Not on file  . Alcohol Use: No     Comment: small glass of wine 3-4 times per yr    Current Outpatient Prescriptions  Medication Sig Dispense Refill  . acetaminophen (TYLENOL) 500 MG tablet Take 1,000 mg by mouth daily as needed for moderate pain.      Marland Kitchen aspirin 81 MG tablet Take 81 mg by mouth daily.        Marland Kitchen atorvastatin (LIPITOR) 10 MG tablet TAKE 1 TABLET (10 MG TOTAL) BY MOUTH DAILY.  30 tablet  3  . dexlansoprazole (DEXILANT) 60 MG capsule Take 1 capsule (60 mg total) by mouth daily.  15 capsule  0  . HYDROcodone-acetaminophen (NORCO/VICODIN) 5-325 MG per tablet Take 1 tablet by mouth every 6 (six) hours as needed.  15 tablet  0  . levothyroxine (SYNTHROID, LEVOTHROID) 50 MCG tablet Take 50 mcg by mouth daily before  breakfast.      . metoprolol (LOPRESSOR) 100 MG tablet TAKE 1 TABLET BY MOUTH TWICE A DAY  60 tablet  0  . omeprazole (PRILOSEC OTC) 20 MG tablet Take 20 mg by mouth daily.      . quinapril (ACCUPRIL) 20 MG tablet TAKE 1 TABLET EVERY DAY  30 tablet  5   No current facility-administered medications for this visit.    No Known Allergies  Review of Systems  Constitutional: Positive for appetite change, fatigue and unexpected weight change (lost 22 pounds in 3 months). Negative for fever.  Eyes: Positive for visual disturbance.  Respiratory: Positive for cough and shortness of breath.   Cardiovascular: Negative for chest pain.       History of CAD, AAA, descending dissection  Gastrointestinal: Positive for abdominal pain and constipation.  Musculoskeletal: Positive for gait problem (balance difficulties).  Neurological: Positive for  dizziness. Negative for seizures, syncope and speech difficulty.       Memory loss  All other systems reviewed and are negative.    BP 144/80  Pulse 92  Resp 16  Ht 5\' 3"  (1.6 m)  Wt 98 lb (44.453 kg)  BMI 17.36 kg/m2  SpO2 98% Physical Exam  Vitals reviewed. Constitutional: She is oriented to person, place, and time.  Thin frail 80 yo woman in NAD  HENT:  Head: Normocephalic and atraumatic.  Eyes: Pupils are equal, round, and reactive to light.  Neck: Neck supple. No thyromegaly present.  Cardiovascular: Normal rate, regular rhythm and normal heart sounds.   No murmur heard. Pulmonary/Chest: Effort normal and breath sounds normal. She has no wheezes. She has no rales.  Abdominal: Soft. There is no tenderness.  Musculoskeletal: She exhibits no edema.  Lymphadenopathy:    She has no cervical adenopathy.  Neurological: She is alert and oriented to person, place, and time.  No focal motor weakness  Skin: Skin is warm and dry.     Diagnostic Tests: CT CHEST WITH CONTRAST 08/18/2013 TECHNIQUE:  Multidetector CT imaging of the chest was performed during  intravenous contrast administration.  CONTRAST: 48mL OMNIPAQUE IOHEXOL 300 MG/ML SOLN  COMPARISON: DG CHEST 2 VIEW dated 08/18/2013  FINDINGS:  There is an abnormal pleural-based mass in the anterior aspect of  the right upper lobe. This measures 3.4 cm AP x 4.5 cm transversely  x 3.9 cm in superior to inferior dimension. It exhibits  heterogeneous density with low density areas suggesting necrosis.  There are destructive changes of the posterior lateral aspect of the  adjacent right first rib. There is increased interstitial density  elsewhere in the right upper lobe inferior to the mass. There is a  subcentimeter nodule demonstrated on image 16 laterally in the right  upper lobe. The right middle and lower lobes exhibit no acute  abnormalities. There are emphysematous changes in both lungs with  large bullous lesions  noted in the left upper lobe. No pulmonary  masses or nodules in the left lung are demonstrated.  Soft tissue density radiates inferiorly from the right upper lobe  mass to the anterior right suprahilar region. There is a right hilar  lymph node on image 19 which measures 16.5 x 13.2 cm. There is a  right paratracheal lymph node measuring 1.7 x 1.5 cm on image 18.  There may be subcarinal lymphadenopathy measuring 2.3 by 1 cm. No  left hilar lymphadenopathy is demonstrated. There is no pleural nor  pericardial effusion. The cardiac chambers are normal in size. There  is dilation of the mid and posterior aspect of the aortic arch with  mural thrombus. The maximal diameter is 4.1 cm. The ascending and  descending thoracic aorta exhibit no evidence of aneurysm or  significant mural thrombus.  Within the upper abdomen the liver demonstrates a somewhat  heterogeneous density but a discrete mass is not demonstrated. No  adrenal masses are evident. There is no splenomegaly. There is a 1  cm diameter accessory spleen. The visualized portions of the right  kidney appear atrophic and nonfunctioning. The visualized portion of  the left kidney appears normal.  There is mild loss of height of the body of T9 without evidence of  lytic lesions.  IMPRESSION:  1. There is an abnormal pleural-based mass in the right upper lobe  anteriorly with invasion of the adjacent first rib posteriorly. The  findings are highly suspicious for malignancy. Follow-up whole-body  PET-CT scanning is recommended.  2. There is an enlarged right suprahilar lymph node and right  peritracheal lymph node. There may be subcarinal lymphadenopathy.  3. No parenchymal nodules are demonstrated in the left lung or in  the right middle or lower lobe. There are emphysematous changes  bilaterally.  4. There is no evidence of CHF. There is no pleural nor pericardial  effusion. There is dilation of the mid posterior aspect of the   aortic arch to 4.1 cm with evidence of mural thrombus.  5. The density of the liver is somewhat heterogeneous, but no  discrete masses are demonstrated. No adrenal masses are  demonstrated.  Electronically Signed  By: David Martinique  On: 08/18/2013 15:21    Impression: 80 yo former smoker with right shoulder pain and weight loss. CT shows a right superior sulcus(Pancoast) tumor and hilar and mediastinal adenopathy.  This is likely T3N2, stage IIIA lesion based on CT. Chemotherapy and radiation are likely to be the treatments of choice once staging is completed.   We need to complete her workup including  1. PET/ CT for staging 2. MR of brain to rule out brain mets- new blurred vision is concerning 3. CT guided biopsy for definitive diagnosis  We will obtain these studies and then proceed with Oncology and Radiation Oncology referrals. Her husband had treatment by Dr. Valere Dross for prostate cancer and interested in having him treat [name] if possible.

## 2013-09-25 NOTE — Op Note (Signed)
NAMESHEVA, [name]                ACCOUNT NO.:  [id number]  MEDICAL RECORD NO.:  [id number]  LOCATION:  [facility]                         FACILITY:  [facility]  PHYSICIAN:  Revonda Standard. Roxan Hockey, M.D.DATE OF BIRTH:  09/17/1928  DATE OF PROCEDURE:  09/25/2013 DATE OF DISCHARGE:  09/25/2013                              OPERATIVE REPORT   PREOPERATIVE DIAGNOSIS:  Lung cancer, needs IV access for chemotherapy.  POSTOPERATIVE DIAGNOSIS:  Lung cancer, needs IV access for chemotherapy.  PROCEDURE:  Port-A-Cath placement, left subclavian vein.  SURGEON:  Revonda Standard. Roxan Hockey, MD  ANESTHESIA:  Local with intravenous sedation.  FINDINGS:  Vein accessed on second pass, wire passed easily.  Wire confirmed in right atrium by fluoroscopy.  Catheter advanced easily with tip located at the junction of the superior vena cava and right atrium. Good blood return and flushed easily.  CLINICAL NOTE:  [name] is an 80 year old woman, who was recently diagnosed with stage III lung cancer.  She needs IV access for chemotherapy.  She was advised to have a Port-A-Cath placed.  The indications, risks, benefits, and alternatives were discussed in detail with the patient. She understood and accepted the risks and agreed to proceed.  OPERATIVE NOTE:  [name] was brought to the operating room on September 25, 2013.  She had intravenous sedation administered and monitored by the Anesthesia Service.  Intravenous antibiotics were administered.  The chest was prepped and draped in usual sterile fashion.  The patient was placed in Trendelenburg position.  The site for needle puncture and access of the vein was anesthetized with 1% lidocaine. After ensuring adequate local anesthetic effect, the vein was accessed on the second pass.  The wire passed easily.  Fluoroscopy confirmed the position of the wire within the right atrium.  The area for the incision and the pocket then was anesthetized with 1% lidocaine.  A  total of 10 mL of lidocaine was used in all.  An incision was made, incorporating the Wire, and a pocket was created.  The 8-French PowerPort ClearVUE was cut to 19 cm and attached to the port, it was pre flushed with heparinized saline.  The peel-away sheath introducer was advanced over the wire and the inner cannula was removed.  The catheter then was advanced and the peel-away sheath introducer was removed.  Fluoroscopy confirmed positioning of the tip of the catheter at the junction of the right atrium and superior vena cava.  There was good return from the port and the port flushed easily.  The port then was placed into the pocket and secured to the pectoralis fascia with 3-0 Vicryl sutures.  The incision was irrigated. The closure then was performed with a 3-0 Vicryl subcutaneous suture and a 4-0 Monocryl subcuticular suture.  Dermabond was applied.  The catheter was flushed with 2 mL of concentrated heparin.  The patient then was taken from the operating room to the postanesthetic care unit in good condition.     Revonda Standard Roxan Hockey, M.D.     SCH/MEDQ  D:  09/25/2013  T:  09/25/2013  Job:  [id number]

## 2013-09-25 NOTE — Discharge Instructions (Addendum)
Do not drive or engage in heavy physical activity for 24 hours  You may use your Hydrocodone as directed for pain.  Do not drive while taking the prescription pain medication- it is a narcotic  You may shower tomorrow.  There is a medical adhesive (Dermabond) on your incision. It will begin to peel off in 7-10 days  If you notice redness, excessive pain or drainage from the incision - call our office [phone]

## 2013-09-25 NOTE — Interval H&P Note (Signed)
History and Physical Interval Note: Needs portacath for IV access for chemo She is aware of indications, risks, benefits and alternatives  09/25/2013 11:42 AM  [name]  has presented today for surgery, with the diagnosis of  lung cancer  The various methods of treatment have been discussed with the patient and family. After consideration of risks, benefits and other options for treatment, the patient has consented to  Procedure(s): INSERTION PORT-A-CATH (N/A) as a surgical intervention .  The patient's history has been reviewed, patient examined, no change in status, stable for surgery.  I have reviewed the patient's chart and labs.  Questions were answered to the patient's satisfaction.     [name] C

## 2013-09-25 NOTE — Brief Op Note (Signed)
09/25/2013  12:41 PM  PATIENT:  [name]  80 y.o. female  PRE-OPERATIVE DIAGNOSIS:   lung cancer  POST-OPERATIVE DIAGNOSIS:  lung cancer  PROCEDURE:  Procedure(s): INSERTION PORT-A-CATH (Left)  SURGEON:  Surgeon(s) and Role:    * Melrose Nakayama, MD - Primary  ANESTHESIA:   local and IV sedation  EBL:     BLOOD ADMINISTERED:none   LOCAL MEDICATIONS USED:  LIDOCAINE  and Amount: 10 ml  SPECIMEN:  No Specimen  DISPOSITION OF SPECIMEN:  N/A  COUNTS:  YES  PLAN OF CARE: Discharge to home after PACU  PATIENT DISPOSITION:  PACU - hemodynamically stable.   Delay start of Pharmacological VTE agent (>24hrs) due to surgical blood loss or risk of bleeding: not applicable

## 2013-09-25 NOTE — Transfer of Care (Signed)
Immediate Anesthesia Transfer of Care Note  Patient: [name]  Procedure(s) Performed: Procedure(s): INSERTION PORT-A-CATH (Left)  Patient Location: PACU  Anesthesia Type:MAC  Level of Consciousness: awake, alert  and oriented  Airway & Oxygen Therapy: Patient Spontanous Breathing  Post-op Assessment: Report given to PACU RN  Post vital signs: Reviewed and stable  Complications: No apparent anesthesia complications

## 2013-09-25 NOTE — Anesthesia Postprocedure Evaluation (Signed)
  Anesthesia Post-op Note  Patient: [name]  Procedure(s) Performed: Procedure(s): INSERTION PORT-A-CATH (Left)  Patient Location: PACU  Anesthesia Type:MAC  Level of Consciousness: awake  Airway and Oxygen Therapy: Patient Spontanous Breathing  Post-op Pain: mild  Post-op Assessment: Post-op Vital signs reviewed  Post-op Vital Signs: Reviewed  Complications: No apparent anesthesia complications

## 2013-09-25 NOTE — Anesthesia Preprocedure Evaluation (Signed)
Anesthesia Evaluation  Patient identified by MRN, date of birth, ID band Patient awake    Reviewed: Allergy & Precautions, H&P , NPO status , Patient's Chart, lab work & pertinent test results, reviewed documented beta blocker date and time   Airway Mallampati: II TM Distance: >3 FB Neck ROM: Full    Dental  (+) Edentulous Upper, Edentulous Lower   Pulmonary former smoker,  breath sounds clear to auscultation        Cardiovascular hypertension, Rhythm:Regular Rate:Normal     Neuro/Psych    GI/Hepatic GERD-  Controlled and Medicated,  Endo/Other  Hypothyroidism   Renal/GU      Musculoskeletal   Abdominal Normal abdominal exam  (+)   Peds  Hematology   Anesthesia Other Findings   Reproductive/Obstetrics                           Anesthesia Physical Anesthesia Plan  ASA: III  Anesthesia Plan: General   Post-op Pain Management:    Induction: Intravenous  Airway Management Planned: LMA  Additional Equipment:   Intra-op Plan:   Post-operative Plan: Extubation in OR  Informed Consent: I have reviewed the patients History and Physical, chart, labs and discussed the procedure including the risks, benefits and alternatives for the proposed anesthesia with the patient or authorized representative who has indicated his/her understanding and acceptance.   Dental advisory given  Plan Discussed with: CRNA, Anesthesiologist and Surgeon  Anesthesia Plan Comments:         Anesthesia Quick Evaluation

## 2013-09-26 ENCOUNTER — Encounter (HOSPITAL_COMMUNITY): Payer: Self-pay | Admitting: Thoracic Surgery (Cardiothoracic Vascular Surgery)

## 2013-09-28 ENCOUNTER — Encounter (HOSPITAL_COMMUNITY): Payer: Self-pay | Admitting: Thoracic Surgery (Cardiothoracic Vascular Surgery)

## 2013-09-28 NOTE — Addendum Note (Signed)
Addendum created 09/28/13 1249 by Finis Bud, MD   Modules edited: Anesthesia Events, Anesthesia Responsible Staff

## 2013-10-05 ENCOUNTER — Encounter (HOSPITAL_COMMUNITY): Payer: Self-pay | Admitting: Emergency Medicine

## 2013-10-05 ENCOUNTER — Observation Stay (HOSPITAL_COMMUNITY)
Admission: EM | Admit: 2013-10-05 | Discharge: 2013-10-06 | Disposition: A | Discharge status: Home / Self Care | Payer: Medicare Other | Attending: Internal Medicine | Admitting: Internal Medicine

## 2013-10-05 DIAGNOSIS — R1011 Right upper quadrant pain: Secondary | ICD-10-CM

## 2013-10-05 DIAGNOSIS — R739 Hyperglycemia, unspecified: Secondary | ICD-10-CM

## 2013-10-05 DIAGNOSIS — R7309 Other abnormal glucose: Secondary | ICD-10-CM | POA: Insufficient documentation

## 2013-10-05 DIAGNOSIS — E039 Hypothyroidism, unspecified: Secondary | ICD-10-CM | POA: Insufficient documentation

## 2013-10-05 DIAGNOSIS — R0989 Other specified symptoms and signs involving the circulatory and respiratory systems: Secondary | ICD-10-CM

## 2013-10-05 DIAGNOSIS — I71012 Dissection of descending thoracic aorta: Secondary | ICD-10-CM

## 2013-10-05 DIAGNOSIS — I7101 Dissection of thoracic aorta: Secondary | ICD-10-CM

## 2013-10-05 DIAGNOSIS — R5383 Other fatigue: Secondary | ICD-10-CM

## 2013-10-05 DIAGNOSIS — R1110 Vomiting, unspecified: Secondary | ICD-10-CM

## 2013-10-05 DIAGNOSIS — Z7982 Long term (current) use of aspirin: Secondary | ICD-10-CM | POA: Insufficient documentation

## 2013-10-05 DIAGNOSIS — I498 Other specified cardiac arrhythmias: Secondary | ICD-10-CM

## 2013-10-05 DIAGNOSIS — N189 Chronic kidney disease, unspecified: Secondary | ICD-10-CM | POA: Insufficient documentation

## 2013-10-05 DIAGNOSIS — I129 Hypertensive chronic kidney disease with stage 1 through stage 4 chronic kidney disease, or unspecified chronic kidney disease: Secondary | ICD-10-CM | POA: Insufficient documentation

## 2013-10-05 DIAGNOSIS — I471 Supraventricular tachycardia, unspecified: Secondary | ICD-10-CM

## 2013-10-05 DIAGNOSIS — E782 Mixed hyperlipidemia: Secondary | ICD-10-CM | POA: Insufficient documentation

## 2013-10-05 DIAGNOSIS — E871 Hypo-osmolality and hyponatremia: Secondary | ICD-10-CM

## 2013-10-05 DIAGNOSIS — D649 Anemia, unspecified: Secondary | ICD-10-CM

## 2013-10-05 DIAGNOSIS — K2270 Barrett's esophagus without dysplasia: Secondary | ICD-10-CM

## 2013-10-05 DIAGNOSIS — R627 Adult failure to thrive: Secondary | ICD-10-CM | POA: Insufficient documentation

## 2013-10-05 DIAGNOSIS — K219 Gastro-esophageal reflux disease without esophagitis: Secondary | ICD-10-CM

## 2013-10-05 DIAGNOSIS — R198 Other specified symptoms and signs involving the digestive system and abdomen: Secondary | ICD-10-CM

## 2013-10-05 DIAGNOSIS — I479 Paroxysmal tachycardia, unspecified: Principal | ICD-10-CM | POA: Insufficient documentation

## 2013-10-05 DIAGNOSIS — C3490 Malignant neoplasm of unspecified part of unspecified bronchus or lung: Secondary | ICD-10-CM

## 2013-10-05 DIAGNOSIS — E860 Dehydration: Secondary | ICD-10-CM

## 2013-10-05 DIAGNOSIS — Z8679 Personal history of other diseases of the circulatory system: Secondary | ICD-10-CM

## 2013-10-05 DIAGNOSIS — R5381 Other malaise: Secondary | ICD-10-CM

## 2013-10-05 DIAGNOSIS — C001 Malignant neoplasm of external lower lip: Secondary | ICD-10-CM | POA: Insufficient documentation

## 2013-10-05 DIAGNOSIS — Z87891 Personal history of nicotine dependence: Secondary | ICD-10-CM | POA: Insufficient documentation

## 2013-10-05 DIAGNOSIS — E785 Hyperlipidemia, unspecified: Secondary | ICD-10-CM

## 2013-10-05 DIAGNOSIS — I10 Essential (primary) hypertension: Secondary | ICD-10-CM

## 2013-10-05 DIAGNOSIS — IMO0002 Reserved for concepts with insufficient information to code with codable children: Secondary | ICD-10-CM

## 2013-10-05 DIAGNOSIS — Z9861 Coronary angioplasty status: Secondary | ICD-10-CM | POA: Insufficient documentation

## 2013-10-05 DIAGNOSIS — C3411 Malignant neoplasm of upper lobe, right bronchus or lung: Secondary | ICD-10-CM

## 2013-10-05 DIAGNOSIS — R109 Unspecified abdominal pain: Secondary | ICD-10-CM

## 2013-10-05 DIAGNOSIS — R197 Diarrhea, unspecified: Secondary | ICD-10-CM

## 2013-10-05 DIAGNOSIS — Z955 Presence of coronary angioplasty implant and graft: Secondary | ICD-10-CM

## 2013-10-05 DIAGNOSIS — I2510 Atherosclerotic heart disease of native coronary artery without angina pectoris: Secondary | ICD-10-CM

## 2013-10-05 DIAGNOSIS — R0609 Other forms of dyspnea: Secondary | ICD-10-CM

## 2013-10-05 HISTORY — DX: Malignant neoplasm of unspecified part of unspecified bronchus or lung: C34.90

## 2013-10-05 LAB — COMPREHENSIVE METABOLIC PANEL
ALT: 17 U/L (ref 0–35)
AST: 26 U/L (ref 0–37)
Albumin: 2.8 g/dL — ABNORMAL LOW (ref 3.5–5.2)
Alkaline Phosphatase: 105 U/L (ref 39–117)
BILIRUBIN TOTAL: 0.5 mg/dL (ref 0.3–1.2)
BUN: 21 mg/dL (ref 6–23)
CO2: 24 meq/L (ref 19–32)
CREATININE: 0.62 mg/dL (ref 0.50–1.10)
Calcium: 9.9 mg/dL (ref 8.4–10.5)
Chloride: 94 mEq/L — ABNORMAL LOW (ref 96–112)
GFR, EST NON AFRICAN AMERICAN: 82 mL/min — AB (ref >90)
GLUCOSE: 146 mg/dL — AB (ref 70–99)
POTASSIUM: 4.5 meq/L (ref 3.7–5.3)
Sodium: 134 mEq/L — ABNORMAL LOW (ref 137–147)
Total Protein: 7 g/dL (ref 6.0–8.3)

## 2013-10-05 LAB — CBC WITH DIFFERENTIAL/PLATELET
BASOS ABS: 0 10*3/uL (ref 0.0–0.1)
BASOS PCT: 0 % (ref 0–1)
EOS ABS: 0 10*3/uL (ref 0.0–0.7)
Eosinophils Relative: 0 % (ref 0–5)
HCT: 34.2 % — ABNORMAL LOW (ref 36.0–46.0)
Hemoglobin: 11.5 g/dL — ABNORMAL LOW (ref 12.0–15.0)
Lymphocytes Relative: 5 % — ABNORMAL LOW (ref 12–46)
Lymphs Abs: 0.4 10*3/uL — ABNORMAL LOW (ref 0.7–4.0)
MCH: 31.9 pg (ref 26.0–34.0)
MCHC: 33.6 g/dL (ref 30.0–36.0)
MCV: 94.7 fL (ref 78.0–100.0)
Monocytes Absolute: 0.2 10*3/uL (ref 0.1–1.0)
Monocytes Relative: 3 % (ref 3–12)
Neutro Abs: 7.1 10*3/uL (ref 1.7–7.7)
Neutrophils Relative %: 93 % — ABNORMAL HIGH (ref 43–77)
Platelets: 289 10*3/uL (ref 150–400)
RBC: 3.61 MIL/uL — ABNORMAL LOW (ref 3.87–5.11)
RDW: 13.9 % (ref 11.5–15.5)
WBC: 7.6 10*3/uL (ref 4.0–10.5)

## 2013-10-05 LAB — MAGNESIUM: MAGNESIUM: 2 mg/dL (ref 1.5–2.5)

## 2013-10-05 LAB — TROPONIN I
Troponin I: <0.3 ng/mL (ref <0.30)
Troponin I: <0.3 ng/mL (ref <0.30)

## 2013-10-05 LAB — URINALYSIS, ROUTINE W REFLEX MICROSCOPIC
BILIRUBIN URINE: NEGATIVE
Glucose, UA: NEGATIVE mg/dL
Hgb urine dipstick: NEGATIVE
KETONES UR: 15 mg/dL — AB
LEUKOCYTES UA: NEGATIVE
Nitrite: NEGATIVE
PH: 6 (ref 5.0–8.0)
PROTEIN: NEGATIVE mg/dL
Specific Gravity, Urine: 1.02 (ref 1.005–1.030)
Urobilinogen, UA: 0.2 mg/dL (ref 0.0–1.0)

## 2013-10-05 MED ORDER — METOPROLOL TARTRATE 50 MG PO TABS
100.0000 mg | ORAL_TABLET | Freq: Two times a day (BID) | ORAL | Status: DC
  Administered 2013-10-06 (×2): 100 mg via ORAL
  Filled 2013-10-05 (×2): qty 2

## 2013-10-05 MED ORDER — DRONABINOL 5 MG PO CAPS
5.0000 mg | ORAL_CAPSULE | Freq: Three times a day (TID) | ORAL | Status: DC
  Administered 2013-10-05 – 2013-10-06 (×3): 5 mg via ORAL
  Filled 2013-10-05 (×3): qty 1

## 2013-10-05 MED ORDER — POLYETHYLENE GLYCOL 3350 17 G PO PACK: 17.0000 g | PACK | Freq: Every day | ORAL | Status: DC | PRN

## 2013-10-05 MED ORDER — ADENOSINE 6 MG/2ML IV SOLN
6.0000 mg | Freq: Once | INTRAVENOUS | Status: AC
  Administered 2013-10-05: 6 mg via INTRAVENOUS
  Filled 2013-10-05: qty 2

## 2013-10-05 MED ORDER — ACETAMINOPHEN 650 MG RE SUPP: 650.0000 mg | Freq: Four times a day (QID) | RECTAL | Status: DC | PRN

## 2013-10-05 MED ORDER — ASPIRIN EC 81 MG PO TBEC
81.0000 mg | DELAYED_RELEASE_TABLET | Freq: Every morning | ORAL | Status: DC
  Administered 2013-10-06: 81 mg via ORAL
  Filled 2013-10-05: qty 1

## 2013-10-05 MED ORDER — ACETAMINOPHEN 325 MG PO TABS: 650.0000 mg | ORAL_TABLET | Freq: Four times a day (QID) | ORAL | Status: DC | PRN

## 2013-10-05 MED ORDER — PANTOPRAZOLE SODIUM 40 MG PO TBEC
40.0000 mg | DELAYED_RELEASE_TABLET | Freq: Every day | ORAL | Status: DC
  Administered 2013-10-05 – 2013-10-06 (×2): 40 mg via ORAL
  Filled 2013-10-05 (×2): qty 1

## 2013-10-05 MED ORDER — LEVOTHYROXINE SODIUM 50 MCG PO TABS
50.0000 ug | ORAL_TABLET | Freq: Every day | ORAL | Status: DC
  Administered 2013-10-06: 50 ug via ORAL
  Filled 2013-10-05: qty 1

## 2013-10-05 MED ORDER — OMEPRAZOLE MAGNESIUM 20 MG PO TBEC: 20.0000 mg | DELAYED_RELEASE_TABLET | Freq: Every day | ORAL | Status: DC

## 2013-10-05 MED ORDER — SODIUM CHLORIDE 0.9 % IV BOLUS (SEPSIS)
700.0000 mL | Freq: Once | INTRAVENOUS | Status: AC
  Administered 2013-10-05: 700 mL via INTRAVENOUS

## 2013-10-05 MED ORDER — DOCUSATE SODIUM 100 MG PO CAPS: 100.0000 mg | ORAL_CAPSULE | Freq: Three times a day (TID) | ORAL | Status: DC | PRN

## 2013-10-05 MED ORDER — ENOXAPARIN SODIUM 40 MG/0.4ML ~~LOC~~ SOLN
40.0000 mg | SUBCUTANEOUS | Status: DC
  Administered 2013-10-05: 40 mg via SUBCUTANEOUS
  Filled 2013-10-05: qty 0.4

## 2013-10-05 MED ORDER — ATORVASTATIN CALCIUM 10 MG PO TABS
10.0000 mg | ORAL_TABLET | Freq: Every evening | ORAL | Status: DC
  Administered 2013-10-05: 10 mg via ORAL
  Filled 2013-10-05: qty 1

## 2013-10-05 MED ORDER — ALUM & MAG HYDROXIDE-SIMETH 200-200-20 MG/5ML PO SUSP: 30.0000 mL | Freq: Four times a day (QID) | ORAL | Status: DC | PRN

## 2013-10-05 MED ORDER — SODIUM CHLORIDE 0.9 % IJ SOLN: 3.0000 mL | Freq: Two times a day (BID) | INTRAMUSCULAR | Status: DC

## 2013-10-05 MED ORDER — ONDANSETRON HCL 4 MG PO TABS: 4.0000 mg | ORAL_TABLET | Freq: Four times a day (QID) | ORAL | Status: DC | PRN

## 2013-10-05 MED ORDER — ONDANSETRON HCL 4 MG/2ML IJ SOLN: 4.0000 mg | Freq: Four times a day (QID) | INTRAMUSCULAR | Status: DC | PRN

## 2013-10-05 MED ORDER — HYDROCODONE-ACETAMINOPHEN 5-325 MG PO TABS
1.0000 | ORAL_TABLET | Freq: Four times a day (QID) | ORAL | Status: DC | PRN
  Administered 2013-10-06: 1 via ORAL
  Filled 2013-10-05: qty 1

## 2013-10-05 MED ORDER — SODIUM CHLORIDE 0.9 % IV SOLN
INTRAVENOUS | Status: AC
  Administered 2013-10-05: 18:00:00 via INTRAVENOUS

## 2013-10-05 NOTE — ED Notes (Signed)
Pt reports home health RN came out for visit today and told her that her HR was 208 and hypotensive.  Pt denies any symptoms.

## 2013-10-05 NOTE — H&P (Signed)
[facility] History and Physical  [name] URK:[id number] DOB: [birth year] DOA: 10/05/2013  Referring physician:  PCP: Leonides Grills, MD   Chief Complaint: tachycardia  HPI: [name] is a 80 y.o. female with past medical hx of stage III [place] lung cancer undergoing chemo and radiation, known SVT, CAD with stents, HTN, hypothyroidism, ckd presents to hospital from home with cc tachycardia. Home health RN making a routine visit found HR to be 208. Patient denies CP/palpitation. She does endorse recent DOE however attributed that to chemo and radiation.she reports feeling weaker lately as well.  She denies abdominal pain/nausea/vomiting. She does report little eating of late due to no appetite. Of note, patient reports that when porta cath inserted 2 weeks ago, she experienced rapid heart rate. She is unsure of treatment at that time. Initial evaluation in ED reveals HR 212. She was given IV adenosine 6mg  and then experienced prolonged pause followed by bradycardia. At time of my exam HR 90's. We are asked to admit for observation and further evaluation.   Review of Systems:  10 point review of systems completed and all systems are negative except as indicated in the history of present illness  Past Medical History  Diagnosis Date  . Coronary artery disease 2000    Status post stent placement  . Hyperlipidemia   . Hypertension   . Cardiac tamponade   . Supraventricular tachycardia   . Dyspnea on exertion   . GERD (gastroesophageal reflux disease)   . Barrett's esophagus   . Pancoast tumor of right lung   . Abdominal aortic aneurysm     Repair Dr. Kellie Simmering 2006  . Descending thoracic aortic dissection 1995  . Hypothyroidism   . History of kidney stones   . Kidney dysfunction     ? one kidney not working per pt ? side  . Lung cancer     stage III NSC   Past Surgical History  Procedure Laterality Date  . Colonoscopy  01/10/2001    Normal  . Egd with biopsy  12/03/2005   . Abdominal aortic aneurysm repair  06/12/2005  . Abdominal aortogram    . Laminectomies      3  . Esophagogastroduodenoscopy  04/10/2009    Dr. Gala Romney short segment of salmon-colored epithelium consistent with prior short segment Barrett's (biopsy benign)  . Cholecystectomy  07/09/10    Chronic cholelithiasis Dr. Arnoldo Morale  . Portacath placement Left 09/25/2013    Procedure: INSERTION PORT-A-CATH;  Surgeon: Melrose Nakayama, MD;  Location: [facility];  Service: Thoracic;  Laterality: Left;   Social History:  reports that she quit smoking about 20 years ago. Her smoking use included Cigarettes. She has a 40 pack-year smoking history. She does not have any smokeless tobacco history on file. She reports that she does not drink alcohol or use illicit drugs. She is retired lives at home with her husband she is independent with ADLs. Currently undergoing chemotherapy and radiation No Known Allergies  Family History  Problem Relation Age of Onset  . Heart disease Mother   . Diabetes Mother   . Heart failure Father      Prior to Admission medications   Medication Sig Start Date End Date Taking? Authorizing Provider  acetaminophen (TYLENOL) 500 MG tablet Take 1,000 mg by mouth daily as needed for moderate pain.   Yes Historical Provider, MD  aspirin EC 81 MG tablet Take 81 mg by mouth every morning.   Yes Historical Provider, MD  atorvastatin (LIPITOR)  10 MG tablet Take 10 mg by mouth every evening.   Yes Historical Provider, MD  docusate sodium (COLACE) 100 MG capsule Take 100 mg by mouth 3 (three) times daily as needed for mild constipation.   Yes Historical Provider, MD  dronabinol (MARINOL) 5 MG capsule Take 5 mg by mouth 3 (three) times daily.   Yes Historical Provider, MD  HYDROcodone-acetaminophen (NORCO/VICODIN) 5-325 MG per tablet Take 1 tablet by mouth every 6 (six) hours as needed for moderate pain. 08/18/13  Yes Merryl Hacker, MD  levothyroxine (SYNTHROID, LEVOTHROID) 50 MCG tablet  Take 50 mcg by mouth daily before breakfast.   Yes Historical Provider, MD  Magnesium Hydroxide (MILK OF MAGNESIA PO) Take 15 mLs by mouth 4 (four) times daily as needed.   Yes Historical Provider, MD  metoprolol (LOPRESSOR) 100 MG tablet Take 1 tablet (100 mg total) by mouth 2 (two) times daily. 09/05/13  Yes Dorothy Spark, MD  omeprazole (PRILOSEC OTC) 20 MG tablet Take 20 mg by mouth daily.   Yes Historical Provider, MD  polyethylene glycol (MIRALAX / GLYCOLAX) packet Take 17 g by mouth daily as needed for mild constipation.   Yes Historical Provider, MD  quinapril (ACCUPRIL) 20 MG tablet Take 20 mg by mouth every morning.   Yes Historical Provider, MD   Physical Exam: Filed Vitals:   10/05/13 1330  BP: 131/68  Pulse: 41  Temp:   Resp: 19    BP 131/68  Pulse 41  Temp(Src) 97.6 F (36.4 C) (Oral)  Resp 19  SpO2 100%  General:  Thin frail somewhat chronically ill-appearing Eyes: PERRL, normal lids, irises & conjunctiva ENT: Ears are clear nose without drainage oropharynx without erythema or exudate. Mucous membranes of her mouth are pink but dry Neck: no LAD, masses or thyromegaly Cardiovascular: Regular rate and rhythm with occasional pauses and/or ectopic beat. I hear no murmur no gallop no rub. There is no lower extremity edema. Left anterior chest with Port-A-Cath site unremarkable Respiratory: CTA bilaterally, no w/r/r. Normal respiratory effort. Abdomen: Flat soft positive bowel sounds throughout nontender to palpation Skin: no rash or induration seen on limited exam  Musculoskeletal: Poor muscle tone, joints without swelling/erythema. Psychiatric: grossly normal mood and affect, speech fluent and appropriate Neurologic: grossly non-focal.speech is clear facial symmetry           Labs on Admission:  Basic Metabolic Panel:  Recent Labs Lab 10/05/13 1156  NA 134*  K 4.5  CL 94*  CO2 24  GLUCOSE 146*  BUN 21  CREATININE 0.62  CALCIUM 9.9  MG 2.0   Liver  Function Tests:  Recent Labs Lab 10/05/13 1156  AST 26  ALT 17  ALKPHOS 105  BILITOT 0.5  PROT 7.0  ALBUMIN 2.8*   No results found for this basename: LIPASE, AMYLASE,  in the last 168 hours No results found for this basename: AMMONIA,  in the last 168 hours CBC:  Recent Labs Lab 10/05/13 1156  WBC 7.6  NEUTROABS 7.1  HGB 11.5*  HCT 34.2*  MCV 94.7  PLT 289   Cardiac Enzymes:  Recent Labs Lab 10/05/13 1156  TROPONINI <0.30    BNP (last 3 results) No results found for this basename: PROBNP,  in the last 8760 hours CBG: No results found for this basename: GLUCAP,  in the last 168 hours  Radiological Exams on Admission: No results found.  EKG: Independently reviewed normal sinus rhythm with ST and T wave abnormalities  Assessment/Plan Principal Problem:  SVT (supraventricular tachycardia): patient hx CAD with stents. Given adenosine in ED followed by pause and bradycardia. HR stable 90S at my exam. Will admit to tele and monitor rate and rhythm. Will continue home BB but hold ace inhibitor. Magnesium level 2.0. Will check TSH. Will obtain echocardiogram for completeness. Will request cardiology consult.  Active Problems:  Anemia: mild. Likely related to chronic disease. Current rate somewhat below baseline. No s/sx active bleeding. Will monitor    Hyponatremia: mild. Likely related to decreased po intake due to no appetite.     Hyperglycemia: no hx of same. Etiology unclear. Will obtain hgA1c.   Lung cancer: stage III NSC lung cancer. Currently undergoing chemo and radiation. She has appointment tomorrow for radiation in afternoon. Hopefully will be able to be discharged in am in time to make this appointment.    Failure to thrive: related to chemo and radiation. Continue marinol. Patient requesting "soft" food as this "tastes better to me right now". Will request PT/OT evaluation as well.     HYPERTENSION, UNSPECIFIED: SBP range 122-131. Will continue home BB  and hold home ace inhibitor.    CAD, NATIVE VESSEL: stent placement 2000. Chart review indicates Dr Verl Blalock cardiologist but has not seen since 2013. No CP. Continue statin and asa.     HYPERLIPIDEMIA-MIXED; will obtain lipid panel       GERD: stable. Continue PPI   cardiology  Code Status: full Family Communication: husband at bedside Disposition Plan: home hopefully tomorrow  Time spent: 68 minutes  [facility] Pager [phone]

## 2013-10-05 NOTE — ED Provider Notes (Signed)
CSN: [id number]     Arrival date & time 10/05/13  1125 History  This chart was scribed for Janice Norrie, MD by Roxan Diesel, ED scribe.  This patient was seen in room APA09/APA09 and the patient's care was started at 11:35 AM.   Chief Complaint  Patient presents with  . Tachycardia    The history is provided by the patient and the spouse. No language interpreter was used.    HPI Comments: [name] is a 80 y.o. female with a history of lung cancer, CAD, SVT, HTN who presents to the Emergency Department complaining of tachycardia that she thinks may have started over the weekend 4-5 days ago.  Pt's home health RN came to visit her today and informed her that her HR was 208. Pt denies palpitations and chest pain. She does state she has dyspnea on exertion. She denies prior h/o similar symptoms.  However, husband states that she did have a similar episode 10 days ago while having a port placed.  He reports during the insertion she had a rapid heart rate. He does not know what they did to treat her fast heart rate. Per husband, pt has had progressively-worsening loss of appetite and weight loss for the past several months. He reports her weight used to be 125 and yesterday she weighed 95 pounds.  She has also had a nonproductive cough for several weeeks. She has been diagnosed with  lung cancer and has received 2 chemotherapy  and 6 radiation treatments.  She was seen by her oncologist yesterday and they decided to not give her her next round of chemotherapy but to give her 1000 cc of IV fluids today for dehydration. Patient appears to have some confusion because she states she's eating well. She does not recall her port being used although her husband says it's been used twice.  PCP Dr Orson Ape Dr. Verl Blalock is her cardiologist   Past Medical History  Diagnosis Date  . Coronary artery disease 2000    Status post stent placement  . Hyperlipidemia   . Hypertension   . Cardiac tamponade   .  Supraventricular tachycardia   . Dyspnea on exertion   . GERD (gastroesophageal reflux disease)   . Barrett's esophagus   . Pancoast tumor of right lung   . Abdominal aortic aneurysm     Repair Dr. Kellie Simmering 2006  . Descending thoracic aortic dissection 1995  . Hypothyroidism   . History of kidney stones   . Kidney dysfunction     ? one kidney not working per pt ? side    Past Surgical History  Procedure Laterality Date  . Colonoscopy  01/10/2001    Normal  . Egd with biopsy  12/03/2005  . Abdominal aortic aneurysm repair  06/12/2005  . Abdominal aortogram    . Laminectomies      3  . Esophagogastroduodenoscopy  04/10/2009    Dr. Gala Romney short segment of salmon-colored epithelium consistent with prior short segment Barrett's (biopsy benign)  . Cholecystectomy  07/09/10    Chronic cholelithiasis Dr. Arnoldo Morale  . Portacath placement Left 09/25/2013    Procedure: INSERTION PORT-A-CATH;  Surgeon: Melrose Nakayama, MD;  Location: [facility] OR;  Service: Thoracic;  Laterality: Left;    Family History  Problem Relation Age of Onset  . Heart disease Mother   . Diabetes Mother   . Heart failure Father     History  Substance Use Topics  . Smoking status: Former Smoker -- 1.00 packs/day  for 40 years    Types: Cigarettes    Quit date: 06/29/1993  . Smokeless tobacco: Not on file  . Alcohol Use: No     Comment: small glass of wine 3-4 times per yr  lives at home Lives with spouse  OB History   Grav Para Term Preterm Abortions TAB SAB Ect Mult Living                   Review of Systems  Constitutional: Positive for appetite change and unexpected weight change.  Respiratory: Positive for cough.   Cardiovascular: Negative for chest pain and palpitations.  All other systems reviewed and are negative.     Allergies  Review of patient's allergies indicates no known allergies.  Home Medications   Current Outpatient Rx  Name  Route  Sig  Dispense  Refill  . acetaminophen  (TYLENOL) 500 MG tablet   Oral   Take 1,000 mg by mouth daily as needed for moderate pain.         Marland Kitchen aspirin EC 81 MG tablet   Oral   Take 81 mg by mouth every morning.         Marland Kitchen atorvastatin (LIPITOR) 10 MG tablet   Oral   Take 10 mg by mouth every evening.         . docusate sodium (COLACE) 100 MG capsule   Oral   Take 100 mg by mouth 3 (three) times daily as needed for mild constipation.         Marland Kitchen dronabinol (MARINOL) 5 MG capsule   Oral   Take 5 mg by mouth 3 (three) times daily.         Marland Kitchen HYDROcodone-acetaminophen (NORCO/VICODIN) 5-325 MG per tablet   Oral   Take 1 tablet by mouth every 6 (six) hours as needed for moderate pain.         Marland Kitchen levothyroxine (SYNTHROID, LEVOTHROID) 50 MCG tablet   Oral   Take 50 mcg by mouth daily before breakfast.         . Magnesium Hydroxide (MILK OF MAGNESIA PO)   Oral   Take 15 mLs by mouth 4 (four) times daily as needed.         . metoprolol (LOPRESSOR) 100 MG tablet   Oral   Take 1 tablet (100 mg total) by mouth 2 (two) times daily.   60 tablet   0     .Marland KitchenPatient needs to contact office to schedule  App ...   . omeprazole (PRILOSEC OTC) 20 MG tablet   Oral   Take 20 mg by mouth daily.         . polyethylene glycol (MIRALAX / GLYCOLAX) packet   Oral   Take 17 g by mouth daily as needed for mild constipation.         . quinapril (ACCUPRIL) 20 MG tablet   Oral   Take 20 mg by mouth every morning.          BP 115/81  Pulse 212  Temp(Src) 97.6 F (36.4 C) (Oral)  Resp 18  SpO2 97%  Vital signs normal except extremities tachycardia   Physical Exam  Nursing note and vitals reviewed. Constitutional:  Non-toxic appearance. She does not appear ill. No distress.  Frail, elderly female.  HENT:  Head: Normocephalic and atraumatic.  Right Ear: External ear normal.  Left Ear: External ear normal.  Nose: Nose normal. No mucosal edema or rhinorrhea.  Mouth/Throat: Oropharynx is clear and moist  and  mucous membranes are normal. No dental abscesses or uvula swelling.  Eyes: Conjunctivae and EOM are normal. Pupils are equal, round, and reactive to light.  Neck: Normal range of motion and full passive range of motion without pain. Neck supple.  Cardiovascular: Regular rhythm and normal heart sounds.  Tachycardia present.  Exam reveals no gallop and no friction rub.   No murmur heard. Pulmonary/Chest: Effort normal and breath sounds normal. No respiratory distress. She has no wheezes. She has no rhonchi. She has no rales. She exhibits no tenderness and no crepitus.  Abdominal: Soft. Normal appearance and bowel sounds are normal. She exhibits no distension. There is no tenderness. There is no rebound and no guarding.  Musculoskeletal: Normal range of motion. She exhibits no edema and no tenderness.  Moves all extremities well.   Neurological: She has normal strength. No cranial nerve deficit.  Confused.  Skin: Skin is warm, dry and intact. No rash noted. No erythema. There is pallor.    ED Course  Procedures (including critical care time)  Medications  adenosine (ADENOCARD) 6 MG/2ML injection 6 mg (6 mg Intravenous Given 10/05/13 1157)  sodium chloride 0.9 % bolus 700 mL (0 mLs Intravenous Stopped 10/05/13 1401)     DIAGNOSTIC STUDIES: Oxygen Saturation is 97% on [place], normal by my interpretation.    COORDINATION OF CARE: 12:02 PM-Discussed treatment plan which includes IV fluids with pt at bedside and pt agreed to plan.   Patient was given IV adenosine, she had a prolonged episode of asystole followed by severe bradycardia and then finally had some tachycardia with rate 130s, until her rhythm stabilized with heart rate in the 90s. Pt had a [place] oxygen in place prior to getting the adenosine.   14:00 Hr is in 80's. Pt states she is feeling better after IV fluids. She is agreeable to admission  14:08 Dr Sheran Fava, admit to obs, tele  Labs Review Results for orders placed during the hospital  encounter of 10/05/13  CBC WITH DIFFERENTIAL      Result Value Ref Range   WBC 7.6  4.0 - 10.5 K/uL   RBC 3.61 (*) 3.87 - 5.11 MIL/uL   Hemoglobin 11.5 (*) 12.0 - 15.0 g/dL   HCT 34.2 (*) 36.0 - 46.0 %   MCV 94.7  78.0 - 100.0 fL   MCH 31.9  26.0 - 34.0 pg   MCHC 33.6  30.0 - 36.0 g/dL   RDW 13.9  11.5 - 15.5 %   Platelets 289  150 - 400 K/uL   Neutrophils Relative % 93 (*) 43 - 77 %   Neutro Abs 7.1  1.7 - 7.7 K/uL   Lymphocytes Relative 5 (*) 12 - 46 %   Lymphs Abs 0.4 (*) 0.7 - 4.0 K/uL   Monocytes Relative 3  3 - 12 %   Monocytes Absolute 0.2  0.1 - 1.0 K/uL   Eosinophils Relative 0  0 - 5 %   Eosinophils Absolute 0.0  0.0 - 0.7 K/uL   Basophils Relative 0  0 - 1 %   Basophils Absolute 0.0  0.0 - 0.1 K/uL  COMPREHENSIVE METABOLIC PANEL      Result Value Ref Range   Sodium 134 (*) 137 - 147 mEq/L   Potassium 4.5  3.7 - 5.3 mEq/L   Chloride 94 (*) 96 - 112 mEq/L   CO2 24  19 - 32 mEq/L   Glucose, Bld 146 (*) 70 - 99 mg/dL   BUN 21  6 - 23 mg/dL   Creatinine, Ser 0.62  0.50 - 1.10 mg/dL   Calcium 9.9  8.4 - 10.5 mg/dL   Total Protein 7.0  6.0 - 8.3 g/dL   Albumin 2.8 (*) 3.5 - 5.2 g/dL   AST 26  0 - 37 U/L   ALT 17  0 - 35 U/L   Alkaline Phosphatase 105  39 - 117 U/L   Total Bilirubin 0.5  0.3 - 1.2 mg/dL   GFR calc non Af Amer 82 (*) >90 mL/min   GFR calc Af Amer >90  >90 mL/min  TROPONIN I      Result Value Ref Range   Troponin I <0.30  <0.30 ng/mL  MAGNESIUM      Result Value Ref Range   Magnesium 2.0  1.5 - 2.5 mg/dL   Laboratory interpretation all normal except mild anemia   Imaging Review No results found.    09/22/2013   CLINICAL DATA:  Port-A-Cath placement  IMPRESSION: Emphysema with known right apical mass lesion, slightly decreased in opacity compared to the previous chest x-ray.   Electronically Signed   By: Misty Stanley M.D.   On: 09/22/2013 14:00   Mr Jeri Cos XA Contrast  09/11/2013   CLINICAL DATA:  81 year old female with recently diagnosed  aggressive right lung mass, staging. Subsequent encounter.Marland Kitchen  IMPRESSION: 1.  No acute or metastatic intracranial abnormality. 2. Chronic small vessel disease.   Electronically Signed   By: Lars Pinks M.D.   On: 09/11/2013 16:06   Nm Pet Image Initial (pi) Skull Base To Thigh  09/08/2013   CLINICAL DATA:  Initial treatment strategy for lung cancer  IMPRESSION: 1. 4.0 x 7.5 cm hypermetabolic mass in the anterior aspect of the right upper lobe with evidence of chest wall invasion, and probable early mediastinal invasion, as well as an enlarged hypermetabolic right paratracheal lymph node. No definite evidence of distal metastatic disease in the neck, abdomen or pelvis. These findings suggest T4, N2, Mx disease (i.e., stage IIIB disease). 2. There is also low level all hypermetabolism within the T8 vertebral body associated with a compression fracture. This has a relatively benign appearance, with the metabolic activity favored to be related to normal bony healing. 3. Atherosclerosis, including left main and 3 vessel coronary artery disease. In addition, there is mild aneurysmal dilatation of the isthmus of the thoracic aorta (4.2 cm in diameter). 4. Additional incidental findings, as above.   Electronically Signed   By: Vinnie Langton M.D.   On: 09/08/2013 14:41   Dg Chest Port 1 View  09/25/2013   CLINICAL DATA:  Post Port-A-Cath placement    IMPRESSION: Left anterior chest wall Port-A-Cath has its tip in the lower superior vena cava. No pneumothorax. No other change from the prior study.   Electronically Signed   By: Lajean Manes M.D.   On: 09/25/2013 14:32   Dg Fluoro Guide Cv Line-no Report  09/25/2013   CLINICAL DATA: port-a-cath   FLOURO GUIDE CV LINE  Fluoroscopy was utilized by the requesting physician.  No radiographic  interpretation.      EKG Interpretation  #1  Date: 10/05/2013  Rate: 212  Rhythm: supraventricular tachycardia (SVT)  QRS Axis: left  Intervals: normal  ST/T Wave  abnormalities: nonspecific ST/T changes  Conduction Disutrbances:none  Narrative Interpretation:   Old EKG Reviewed: changes noted  #2.  Date: 10/05/2013  Rate: 120  Rhythm: sinus tachycardia  QRS Axis: normal  Intervals: PR shortened, prolonged QTc  ST/T Wave abnormalities: nonspecific ST/T changes  Conduction Disutrbances:none  Narrative Interpretation:   Old EKG Reviewed: changes noted from EKG done earlier today at 11:29  #3  Date/Time:  Thursday October 05 2013 11:53:48 EDT Ventricular Rate:  98 PR Interval:  116 QRS Duration: 72 QT Interval:  284 QTC Calculation: 362 R Axis:   3 Text Interpretation:  Sinus rhythm with Premature atrial complexes with  Abberant conduction ST \\T \ T wave abnormality, consider inferolateral  ischemia When compared with ECG of 05-Oct-2013 11:52, No significant  change was found Confirmed by Taryne Kiger  MD-I, Lennyn Bellanca ([id number]) on 10/05/2013  12:13:26 PM     MDM   Final diagnoses:  SVT (supraventricular tachycardia)  Dehydration  Lung cancer   Plan admission  Rolland Porter, MD, [place] Performed by: Mariena Meares L Indyah Saulnier Total critical care time: 36 min Critical care time was exclusive of separately billable procedures and treating other patients. Critical care was necessary to treat or prevent imminent or life-threatening deterioration. Critical care was time spent personally by me on the following activities: development of treatment plan with patient and/or surrogate as well as nursing, discussions with consultants, evaluation of patient's response to treatment, examination of patient, obtaining history from patient or surrogate, ordering and performing treatments and interventions, ordering and review of laboratory studies, ordering and review of radiographic studies, pulse oximetry and re-evaluation of patient's condition.     I personally performed the services described in this documentation, which was scribed in my presence. The recorded  information has been reviewed and considered.  Rolland Porter, MD, Abram Sander    Janice Norrie, MD 10/05/13 [phone]

## 2013-10-05 NOTE — H&P (Addendum)
Patient seen and examined.  Agree with excellent note by NP Dyanne Carrel.  Plan of care discussed.    [name] is a frail 80 yo F with NSCLC, squamous cell, who is currently undergoing chemotherapy and radiation.  She has history of asymptomatic SVT for which she was followed by cardiologist Dr. Verl Blalock in the past.  For the last few days she has been feeling a little lightheaded, but otherwise well.  Denies chest pain, SOB, nausea, vomiting.  Her tachycardia was routinely discovered today and she was transferred to the hospital where she was found to be in SVT, rate in the 200s.  Her BP was stable.  She was successfully cardioverted with adenosine.  She is already on metoprolol 100mg  BID.  She denies any heart failure like symptoms.  We have been asked to observe the patient overnight.  Most likely, she had SVT triggered by the stress of cancer, chemotherapy, and radiation.  She has lost weight and become quite weak since starting treatment.  She will be at risk for more episodes.  Will check ECHO to establish EF, particularly as this tachycardia may have been ongoing, and ask cardiology whether any medications should be added/adjusted to reduce the risk of recurrent SVT.  Oncologist to please consider patient's functional decline/functional status when determining whether she can tolerate more chemotherapy.  Will check pre-albumin also.  Anticipate home tomorrow.

## 2013-10-06 DIAGNOSIS — Z8679 Personal history of other diseases of the circulatory system: Secondary | ICD-10-CM

## 2013-10-06 DIAGNOSIS — I359 Nonrheumatic aortic valve disorder, unspecified: Secondary | ICD-10-CM

## 2013-10-06 DIAGNOSIS — I71019 Dissection of thoracic aorta, unspecified: Secondary | ICD-10-CM

## 2013-10-06 DIAGNOSIS — E785 Hyperlipidemia, unspecified: Secondary | ICD-10-CM

## 2013-10-06 DIAGNOSIS — Z9861 Coronary angioplasty status: Secondary | ICD-10-CM

## 2013-10-06 DIAGNOSIS — I7101 Dissection of thoracic aorta: Secondary | ICD-10-CM

## 2013-10-06 DIAGNOSIS — I2510 Atherosclerotic heart disease of native coronary artery without angina pectoris: Secondary | ICD-10-CM

## 2013-10-06 DIAGNOSIS — I10 Essential (primary) hypertension: Secondary | ICD-10-CM

## 2013-10-06 DIAGNOSIS — D649 Anemia, unspecified: Secondary | ICD-10-CM

## 2013-10-06 LAB — BASIC METABOLIC PANEL
BUN: 18 mg/dL (ref 6–23)
CHLORIDE: 103 meq/L (ref 96–112)
CO2: 24 mEq/L (ref 19–32)
Calcium: 9.2 mg/dL (ref 8.4–10.5)
Creatinine, Ser: 0.6 mg/dL (ref 0.50–1.10)
GFR calc non Af Amer: 83 mL/min — ABNORMAL LOW (ref >90)
GLUCOSE: 112 mg/dL — AB (ref 70–99)
POTASSIUM: 4.6 meq/L (ref 3.7–5.3)
SODIUM: 138 meq/L (ref 137–147)

## 2013-10-06 LAB — CBC
HEMATOCRIT: 32 % — AB (ref 36.0–46.0)
HEMOGLOBIN: 10.8 g/dL — AB (ref 12.0–15.0)
MCH: 32 pg (ref 26.0–34.0)
MCHC: 33.8 g/dL (ref 30.0–36.0)
MCV: 95 fL (ref 78.0–100.0)
Platelets: 254 10*3/uL (ref 150–400)
RBC: 3.37 MIL/uL — ABNORMAL LOW (ref 3.87–5.11)
RDW: 13.8 % (ref 11.5–15.5)
WBC: 7.6 10*3/uL (ref 4.0–10.5)

## 2013-10-06 LAB — PREALBUMIN: Prealbumin: 9.9 mg/dL — ABNORMAL LOW (ref 17.0–34.0)

## 2013-10-06 LAB — TSH: TSH: 1.36 u[IU]/mL (ref 0.350–4.500)

## 2013-10-06 LAB — TROPONIN I: Troponin I: <0.3 ng/mL (ref <0.30)

## 2013-10-06 LAB — HEMOGLOBIN A1C
Hgb A1c MFr Bld: 6 % — ABNORMAL HIGH (ref <5.7)
Mean Plasma Glucose: 126 mg/dL — ABNORMAL HIGH (ref <117)

## 2013-10-06 MED ORDER — DILTIAZEM HCL 30 MG PO TABS: 30.0000 mg | ORAL_TABLET | Freq: Two times a day (BID) | ORAL | Status: AC

## 2013-10-06 MED ORDER — DILTIAZEM HCL 30 MG PO TABS
30.0000 mg | ORAL_TABLET | Freq: Two times a day (BID) | ORAL | Status: DC
  Administered 2013-10-06: 30 mg via ORAL
  Filled 2013-10-06: qty 1

## 2013-10-06 NOTE — Evaluation (Signed)
Occupational Therapy Evaluation Patient Details Name: [name] MRN: [id number] DOB: 03-Aug-1928 Today's Date: 10/06/2013    History of Present Illness [name] is a 80 y.o. female with past medical hx of stage III NSC lung cancer undergoing chemo and radiation, known SVT, CAD with stents, HTN, hypothyroidism, ckd presents to hospital from home with cc tachycardia. Home health RN making a routine visit found HR to be 208. Patient denies CP/palpitation. She does endorse recent DOE however attributed that to chemo and radiation.she reports feeling weaker lately as well.  She denies abdominal pain/nausea/vomiting. She does report little eating of late due to no appetite. Of note, patient reports that when porta cath inserted 2 weeks ago, she experienced rapid heart rate. She is unsure of treatment at that time. Initial evaluation in ED reveals HR 212. She was given IV adenosine 6mg  and then experienced prolonged pause followed by bradycardia. At time of my exam HR 90's   Clinical Impression   Pt is presenting to acute OT with above situation.  She verbalizes being unable to tell when her HR has significantly risen, but does verbalize feeling more fatigued lately (which may also be as result of current cancer treatments).  Pt is currently receiving HHOT through [facility]- recommend continuation of these services for general strengthening, improving ADL status, safety, and caregiver education. Acute OT to sign off.    Follow Up Recommendations  Home health OT (Continuation of HHOT services pt was receiving through [facility].)    Equipment Recommendations       Recommendations for Other Services       Precautions / Restrictions Precautions Precautions: Fall Restrictions Weight Bearing Restrictions: No      Mobility Bed Mobility                  Transfers                      Balance                                            ADL  Overall ADL's : At baseline                                       General ADL Comments: Pt's current basline is of decreasing ADL status due to new round of chemo and radiation.  Pt is increasingly fatigued. per pt she can complete ADL tasks independnetly, but it is likely she will require more assist due to fatigue and weakness.     Vision                     Perception     Praxis      Pertinent Vitals/Pain      Hand Dominance     Extremity/Trunk Assessment Upper Extremity Assessment Upper Extremity Assessment: Generalized weakness   Lower Extremity Assessment Lower Extremity Assessment: Defer to PT evaluation       Communication     Cognition Arousal/Alertness: Awake/alert Behavior During Therapy: WFL for tasks assessed/performed Overall Cognitive Status: Within Functional Limits for tasks assessed                     General Comments  Exercises       Shoulder Instructions      Home Living Family/patient expects to be discharged to:: Private residence Living Arrangements: Spouse/significant other;Children Available Help at Discharge: Family Type of Home: House Home Access: Level entry           Bathroom Shower/Tub: Walk-in Corporate treasurer Toilet: Standard     Home Equipment: Environmental consultant - 4 wheels;Cane - single point;Wheelchair - manual          Prior Functioning/Environment Level of Independence: Needs assistance  Gait / Transfers Assistance Needed: Pt reports being afraid to walk independently. ADL's / Homemaking Assistance Needed: Pt has been increasingly needing assist for IADL needs (including medication management). Per pt she is able to do ADL tasks independenlty, but husband indicated that he assist with dressinga nd bathing tasks.        OT Diagnosis: Generalized weakness   OT Problem List: Decreased strength;Decreased activity tolerance;Cardiopulmonary status limiting activity   OT  Treatment/Interventions:      OT Goals(Current goals can be found in the care plan section) Acute Rehab OT Goals Patient Stated Goal: to go home - no acute OT goals needed.  OT Frequency:     Barriers to D/C:            Co-evaluation              End of Session    Activity Tolerance: Patient tolerated treatment well Patient left: in bed;with nursing/sitter in room;with family/visitor present;with call bell/phone within reach;with bed alarm set   Time: 1735-6701 OT Time Calculation (min): 34 min Charges:  OT General Charges $OT Visit: 1 Procedure OT Evaluation $Initial OT Evaluation Tier I: 1 Procedure G-Codes: OT G-codes **NOT FOR INPATIENT CLASS** Functional Assessment Tool Used: Clinical Judgement  Functional Limitation: Self care Self Care Current Status (I1030): At least 20 percent but less than 40 percent impaired, limited or restricted Self Care Goal Status (D3143): At least 20 percent but less than 40 percent impaired, limited or restricted Self Care Discharge Status [id number]): At least 20 percent but less than 40 percent impaired, limited or restricted    Bea Graff, [place], OTR/L [phone] 10/06/2013, 12:23 PM

## 2013-10-06 NOTE — Progress Notes (Signed)
Patient with orders to be discharge. Discharge instructions given, patient and spouse verbalized understanding. Patient stable. Patient left in private vehicle with family.

## 2013-10-06 NOTE — Discharge Summary (Signed)
Patient seen and examined.  Case discussed with cardiology and NP K. Black.  Ms. Nangle advised to stop her ACEI and start diltiazem to reduce the risk of SVT.  ECHO is pending at time of discharge.  F/u for XRT later today and cardiology next week.

## 2013-10-06 NOTE — Discharge Summary (Signed)
Physician Discharge Summary  LENNAN [name] OFB:[id number] DOB: January 10, 1929 DOA: 10/05/2013  PCP: Leonides Grills, MD  Admit date: 10/05/2013 Discharge date: 10/06/2013  Time spent:  minutes  Recommendations for Outpatient Follow-up:  1. Follow up with PCP 1 week for evaluation of BP and HR as medications changed.   2. Continue home health PT.   Discharge Diagnoses:  Principal Problem:   SVT (supraventricular tachycardia) Active Problems:   HYPERLIPIDEMIA-MIXED   HYPERTENSION, UNSPECIFIED   CAD, NATIVE VESSEL   GERD   Failure to thrive   Lung cancer   Anemia   Hyponatremia   Hyperglycemia   Discharge Condition: stable  Diet recommendation: regular  There were no vitals filed for this visit.  History of present illness:  [name] is a 80 y.o. female with past medical hx of stage III [place] lung cancer undergoing chemo and radiation, known SVT, CAD with stents, HTN, hypothyroidism, ckd presented to hospital from home on 10/05/13 with cc tachycardia. Home health RN making a routine visit found HR to be 208. Patient denied CP/palpitation. She did endorse recent DOE however attributed that to chemo and radiation.she reported feeling weaker lately as well. She denied abdominal pain/nausea/vomiting. She reported little eating of late due to no appetite. Of note, patient reported that when porta cath inserted 2 weeks ago, she experienced rapid heart rate. She was unsure of treatment at that time. Initial evaluation in ED revealed SVT with HR 212. She was given IV adenosine 6mg  and then experienced prolonged pause followed by bradycardia. At time of my exam HR 90's. We are asked to admit for observation and further evaluation.   Hospital Course:  Principal Problem:  SVT (supraventricular tachycardia): patient hx CAD with stents. Given adenosine in ED followed by pause and bradycardia.admitted for observation. No events on tele except occasional PVC during the night. HR stable 70-80. TSH  and  Magnesium and potassium level within the limits of normal. Evaluated by cards who opine likely trigger by NSCLC and associated treatments in setting of dehydration. Recommends low-dose diltiazem 30mg  BID and continuing to hold ace inhibitor. Continue BB. At discharge  Echocardiogram results pending. Will follow. Recommend close OP follow up to evaluate BP and HR given medication changes.   Active Problems:  Anemia: mild. Stable at discharge.  Likely related to chronic disease.  No s/sx active bleeding.   Hyponatremia: mild. Resolved at discharge.  Likely related to decreased po intake due to no appetite.   Hyperglycemia: no hx of same. hgA1c 6.0. Trending down at discharge.   Lung cancer: stage III [place] lung cancer. Currently undergoing chemo and radiation. She has appointment for radiation in afternoon. will discharge in time to make this appointment.   Failure to thrive: related to chemo and radiation. Continue marinol. Patient requesting "soft" food as this "tastes better to me right now". Continue HH PT and RN and SW   HYPERTENSION, UNSPECIFIED: controlled. Will continue home BB and hold home ace inhibitor. See #1.   CAD, NATIVE VESSEL: stent placement 2000. Chart review indicates Dr Verl Blalock cardiologist but has not seen since 2013. No CP. Continue statin and asa.   HYPERLIPIDEMIA-MIXED; continue statin  GERD: stable. Continue PPI    Procedures:  2 d-ehco cardiogram  Consultations:  cardiology  Discharge Exam: Filed Vitals:   10/06/13 0401  BP: 129/75  Pulse: 70  Temp: 98 F (36.7 C)  Resp: 18    General: thin frail chronically ill appearing Cardiovascular: RRR with occasional ectopic beat. No  LE edema Respiratory: normal effort slightly shallow BS distant but clear.   Discharge Instructions You were cared for by a hospitalist during your hospital stay. If you have any questions about your discharge medications or the care you received while you were in the hospital  after you are discharged, you can call the unit and asked to speak with the hospitalist on call if the hospitalist that took care of you is not available. Once you are discharged, your primary care physician will handle any further medical issues. Please note that NO REFILLS for any discharge medications will be authorized once you are discharged, as it is imperative that you return to your primary care physician (or establish a relationship with a primary care physician if you do not have one) for your aftercare needs so that they can reassess your need for medications and monitor your lab values.     Medication List    STOP taking these medications       quinapril 20 MG tablet  Commonly known as:  ACCUPRIL      TAKE these medications       acetaminophen 500 MG tablet  Commonly known as:  TYLENOL  Take 1,000 mg by mouth daily as needed for moderate pain.     aspirin EC 81 MG tablet  Take 81 mg by mouth every morning.     atorvastatin 10 MG tablet  Commonly known as:  LIPITOR  Take 10 mg by mouth every evening.     diltiazem 30 MG tablet  Commonly known as:  CARDIZEM  Take 1 tablet (30 mg total) by mouth 2 (two) times daily.     docusate sodium 100 MG capsule  Commonly known as:  COLACE  Take 100 mg by mouth 3 (three) times daily as needed for mild constipation.     dronabinol 5 MG capsule  Commonly known as:  MARINOL  Take 5 mg by mouth 3 (three) times daily.     HYDROcodone-acetaminophen 5-325 MG per tablet  Commonly known as:  NORCO/VICODIN  Take 1 tablet by mouth every 6 (six) hours as needed for moderate pain.     levothyroxine 50 MCG tablet  Commonly known as:  SYNTHROID, LEVOTHROID  Take 50 mcg by mouth daily before breakfast.     metoprolol 100 MG tablet  Commonly known as:  LOPRESSOR  Take 1 tablet (100 mg total) by mouth 2 (two) times daily.     MILK OF MAGNESIA PO  Take 15 mLs by mouth 4 (four) times daily as needed.     omeprazole 20 MG tablet   Commonly known as:  PRILOSEC OTC  Take 20 mg by mouth daily.     polyethylene glycol packet  Commonly known as:  MIRALAX / GLYCOLAX  Take 17 g by mouth daily as needed for mild constipation.       No Known Allergies     Follow-up Information   Follow up with Leonides Grills, MD. Schedule an appointment as soon as possible for a visit in 1 week. (follow up with PCP 1 week for evaluation of HR as medications added)    Specialty:  Family Medicine   Contact information:   [address] [place] [address] [phone]        The results of significant diagnostics from this hospitalization (including imaging, microbiology, ancillary and laboratory) are listed below for reference.    Significant Diagnostic Studies: Dg Chest 2 View Within Previous 72  Hours.  Films Obtained On Friday Are Acceptable For Monday And Tuesday Cases  09/22/2013   CLINICAL DATA:  Port-A-Cath placement  EXAM: CHEST  2 VIEW  COMPARISON:  CT SIM LUNG dated 09/20/2013; DG CHEST 2 VIEW dated 08/18/2013; NM PET IMAGE INITIAL (PI) SKULL BASE TO THIGH dated 09/08/2013  FINDINGS: The lungs are hyperexpanded compatible emphysema. Hazy opacity overlies the right apex, corresponding to the known right apical mass. Cardiopericardial silhouette is at upper limits of normal for size. Anterior wedge compression deformity of a mid thoracic vertebral body is stable.  IMPRESSION: Emphysema with known right apical mass lesion, slightly decreased in opacity compared to the previous chest x-ray.   Electronically Signed   By: Misty Stanley M.D.   On: 09/22/2013 14:00   Mr Jeri Cos GY Contrast  09/11/2013   CLINICAL DATA:  80 year old female with recently diagnosed aggressive right lung mass, staging. Subsequent encounter.  EXAM: MRI HEAD WITHOUT AND WITH CONTRAST  TECHNIQUE: Multiplanar, multiecho pulse sequences of the brain and surrounding structures were obtained without and with intravenous contrast.   CONTRAST:  27mL MULTIHANCE GADOBENATE DIMEGLUMINE 529 MG/ML IV SOLN  COMPARISON:  None.  FINDINGS: Major intracranial vascular flow voids are preserved, dominant distal right vertebral artery. No restricted diffusion or evidence of acute infarction. No ventriculomegaly. T2 heterogeneity throughout the deep gray matter nuclei in part related to perivascular spaces. Chronic lacunar infarcts in the cerebellum (maximal left inferior cerebellar hemisphere). Moderate patchy and confluent pons and cerebral white matter T2 and FLAIR hyperintensity. No cortical encephalomalacia identified. Visible internal auditory structures appear normal. Visualized orbit soft tissues are within normal limits.  No abnormal enhancement identified. Negative visualized cervical spine. Normal bone marrow signal.  No midline shift, mass effect, ventriculomegaly, extra-axial collection or acute intracranial hemorrhage. Cervicomedullary junction and pituitary are within normal limits. Visualized scalp soft tissues are within normal limits.  IMPRESSION: 1.  No acute or metastatic intracranial abnormality. 2. Chronic small vessel disease.   Electronically Signed   By: Lars Pinks M.D.   On: 09/11/2013 16:06   Nm Pet Image Initial (pi) Skull Base To Thigh  09/08/2013   CLINICAL DATA:  Initial treatment strategy for lung cancer.  EXAM: NUCLEAR MEDICINE PET SKULL BASE TO THIGH  TECHNIQUE: 6.2 mCi F-18 FDG was injected intravenously. Full-ring PET imaging was performed from the skull base to thigh after the radiotracer. CT data was obtained and used for attenuation correction and anatomic localization.  FASTING BLOOD GLUCOSE:  Value: 105 mg/dl  COMPARISON:  Chest CT 06/17/2014.  FINDINGS: NECK  No hypermetabolic lymph nodes in the neck.  CHEST  Large mass in the anterior aspect of the right upper lobe is again noted, measuring approximately 4.0 x 7.5 cm on today's examination. This mass is diffusely hypermetabolic (SUVmax = 69.4), and demonstrates  increasing invasion of the lateral aspect of the right first rib when compared to the prior study. This mass is also a intimately associated with the undersurface of the right subclavian vein and innominate vein, suggesting some degree of mediastinal invasion. Mildly hypermetabolic discretion hypermetabolic right paratracheal lymph node measuring 1.4 cm in short (SUVmax = 10.3). No other definite hypermetabolic mediastinal or hilar lymphadenopathy. Severe centrilobular emphysema, most pronounced throughout the lung apices bilaterally. No acute consolidative airspace disease. No pleural effusions. Heart size is normal. There is no significant pericardial fluid, thickening or pericardial calcification. There is atherosclerosis of the thoracic aorta, the great vessels of the mediastinum and the coronary arteries, including calcified atherosclerotic  plaque in the left main, left anterior descending, left circumflex and right coronary arteries. In addition, there is mild aneurysmal dilatation of the isthmus of the thoracic aorta (4.2 cm in diameter).  ABDOMEN/PELVIS  No abnormal hypermetabolic activity within the liver, pancreas, adrenal glands, or spleen. No hypermetabolic lymph nodes in the abdomen or pelvis. Status post cholecystectomy. Right renal atrophy. 1.4 cm low-attenuation lesion extending exophytically off the lower pole of the atrophied right kidney demonstrates no internal hypermetabolism, and although incompletely characterized on the noncontrast CT examination, strongly favored to represent a cyst. Extensive atherosclerosis throughout the abdominal and pelvic vasculature, including postoperative changes of aortobifemoral bypass graft placement. No significant volume of ascites. No pneumoperitoneum. No pathologic distention of small bowel. Uterus and ovaries are atrophic, but otherwise unremarkable in appearance.  SKELETON  Mild sclerosis throughout the T8 vertebral body with compression fracture an  approximately 30% loss of anterior vertebral body height. This does demonstrate some low-level hypermetabolism (SUVmax = 2.9) diffusely throughout the vertebral body, favored to represent normal metabolic activity from healing. No other aggressive appearing hypermetabolic bony lesions.  IMPRESSION: 1. 4.0 x 7.5 cm hypermetabolic mass in the anterior aspect of the right upper lobe with evidence of chest wall invasion, and probable early mediastinal invasion, as well as an enlarged hypermetabolic right paratracheal lymph node. No definite evidence of distal metastatic disease in the neck, abdomen or pelvis. These findings suggest T4, N2, Mx disease (i.e., stage IIIB disease). 2. There is also low level all hypermetabolism within the T8 vertebral body associated with a compression fracture. This has a relatively benign appearance, with the metabolic activity favored to be related to normal bony healing. 3. Atherosclerosis, including left main and 3 vessel coronary artery disease. In addition, there is mild aneurysmal dilatation of the isthmus of the thoracic aorta (4.2 cm in diameter). 4. Additional incidental findings, as above.   Electronically Signed   By: Vinnie Langton M.D.   On: 09/08/2013 14:41   Dg Chest Port 1 View  09/25/2013   CLINICAL DATA:  Post Port-A-Cath placement  EXAM: PORTABLE CHEST - 1 VIEW  COMPARISON:  09/22/2013  FINDINGS: Left anterior chest wall Port-A-Cath has its catheter tip in the lower superior vena cava. No pneumothorax.  Right upper lobe opacity is without significant change from the prior study allowing for differences in positioning and technique.  There is volume loss on the right. Left lung is hyperexpanded. No acute findings noted in the lungs. The cardiac silhouette is normal in size. Aorta is tortuous.  Bony thorax is diffusely demineralized but grossly intact.  IMPRESSION: Left anterior chest wall Port-A-Cath has its tip in the lower superior vena cava. No pneumothorax. No  other change from the prior study.   Electronically Signed   By: Lajean Manes M.D.   On: 09/25/2013 14:32   Dg Fluoro Guide Cv Line-no Report  09/25/2013   CLINICAL DATA: port-a-cath   FLOURO GUIDE CV LINE  Fluoroscopy was utilized by the requesting physician.  No radiographic  interpretation.     Microbiology: No results found for this or any previous visit (from the past 240 hour(s)).   Labs: Basic Metabolic Panel:  Recent Labs Lab 10/05/13 1156 10/06/13 0505  NA 134* 138  K 4.5 4.6  CL 94* 103  CO2 24 24  GLUCOSE 146* 112*  BUN 21 18  CREATININE 0.62 0.60  CALCIUM 9.9 9.2  MG 2.0  --    Liver Function Tests:  Recent Labs Lab 10/05/13 1156  AST 26  ALT 17  ALKPHOS 105  BILITOT 0.5  PROT 7.0  ALBUMIN 2.8*   No results found for this basename: LIPASE, AMYLASE,  in the last 168 hours No results found for this basename: AMMONIA,  in the last 168 hours CBC:  Recent Labs Lab 10/05/13 1156 10/06/13 0505  WBC 7.6 7.6  NEUTROABS 7.1  --   HGB 11.5* 10.8*  HCT 34.2* 32.0*  MCV 94.7 95.0  PLT 289 254   Cardiac Enzymes:  Recent Labs Lab 10/05/13 1156 10/05/13 1741 10/06/13 0505  TROPONINI <0.30 <0.30 <0.30   BNP: BNP (last 3 results) No results found for this basename: PROBNP,  in the last 8760 hours CBG: No results found for this basename: GLUCAP,  in the last 168 hours     Signed:  Lezlie Octave Jaylyn Iyer  [facility] 10/06/2013, 10:52 AM

## 2013-10-06 NOTE — Care Management Note (Signed)
    Page 1 of 1   10/06/2013     3:26:52 PM   CARE MANAGEMENT NOTE 10/06/2013  Patient:  [name], [name]   Account Number:  [id number]  Date Initiated:  10/06/2013  Documentation initiated by:  Claretha Cooper  Subjective/Objective Assessment:   Pt lives with spouse. Active with HH RN, PT and OT. Will also add SW.     Action/Plan:   Anticipated DC Date:  10/06/2013   Anticipated DC Plan:  [place]  CM consult      [facility] Choice  Resumption Of Svcs/PTA Provider   Choice offered to / List presented to:          [facility] arranged  HH-1 RN  HH-10 DISEASE MANAGEMENT  HH-2 PT  HH-3 OT  [place]      [place].   Status of service:  Completed, signed off Medicare Important Message given?   (If response is "NO", the following Medicare IM given date fields will be blank) Date Medicare IM given:   Date Additional Medicare IM given:    Discharge Disposition:    Per UR Regulation:    If discussed at Long Length of Stay Meetings, dates discussed:    Comments:  10/06/13 Claretha Cooper RN BSN CM

## 2013-10-06 NOTE — Progress Notes (Signed)
  Echocardiogram 2D Echocardiogram has been performed.  Ajeenah Heiny Orlean Patten 10/06/2013, 12:09 PM

## 2013-10-06 NOTE — Consult Note (Signed)
CARDIOLOGY CONSULT NOTE  Patient ID: KAZI MONTORO MRN: [id number] DOB/AGE: 1928-11-27 80 y.o.  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 Other significant PMH includes stage III NSCLC for which she is undergoing chemotherapy and radiation treatments. She had a visit from a home health nurse yesterday and was found to be tachycardic with HR's in the 200 bpm range. She was brought to the hospital where an ECG showed SVT, 212 bpm. Non-specific ST-T abnormalities were also noted. She was given IV adenosine which reportedly led to a long pause followed by bradycardia, then subsequently sinus rhythm with PAC's. K, Mg, and TSH have all been checked and are normal. She was asymptomatic, specifically denying chest pain and palpitations. She has noted increasing dyspnea with exertion over the past several weeks, but denies leg swelling. She does c/o fatigue and diminished appetite. This morning, she has some mild abdominal discomfort. While having a porta cath placed recently, she was also noted to be tachycardic. At the time of my evaluation, HR is in 80 bpm range with PAC's.      No Known Allergies  Current Facility-Administered Medications  Medication Dose Route Frequency Provider Last Rate Last Dose  . acetaminophen (TYLENOL) tablet 650 mg  650 mg Oral Q6H PRN Radene Gunning, NP       Or  . acetaminophen (TYLENOL) suppository 650 mg  650 mg Rectal Q6H PRN Radene Gunning, NP      . alum & mag hydroxide-simeth (MAALOX/MYLANTA) 200-200-20 MG/5ML suspension 30 mL  30 mL Oral Q6H PRN Radene Gunning, NP      . aspirin EC tablet 81 mg  81 mg Oral q morning - 10a Lezlie Octave Black, NP       . atorvastatin (LIPITOR) tablet 10 mg  10 mg Oral QPM Radene Gunning, NP   10 mg at 10/05/13 1826  . docusate sodium (COLACE) capsule 100 mg  100 mg Oral TID PRN Radene Gunning, NP      . dronabinol (MARINOL) capsule 5 mg  5 mg Oral TID Radene Gunning, NP   5 mg at 10/06/13 0019  . enoxaparin (LOVENOX) injection 40 mg  40 mg Subcutaneous Q24H Radene Gunning, NP   40 mg at 10/05/13 1826  . HYDROcodone-acetaminophen (NORCO/VICODIN) 5-325 MG per tablet 1 tablet  1 tablet Oral Q6H PRN Radene Gunning, NP      . levothyroxine (SYNTHROID, LEVOTHROID) tablet 50 mcg  50 mcg Oral QAC breakfast Radene Gunning, NP      . metoprolol (LOPRESSOR) tablet 100 mg  100 mg Oral BID Radene Gunning, NP   100 mg at 10/06/13 0019  . ondansetron (ZOFRAN) tablet 4 mg  4 mg Oral Q6H PRN Radene Gunning, NP       Or  . ondansetron [facility]) injection 4 mg  4 mg Intravenous Q6H PRN Radene Gunning, NP      . pantoprazole (PROTONIX) EC tablet 40 mg  40 mg Oral Daily Janece Canterbury, MD   40 mg at 10/05/13 1826  . polyethylene glycol (MIRALAX / GLYCOLAX) packet 17 g  17 g Oral Daily PRN Radene Gunning, NP      .  sodium chloride 0.9 % injection 3 mL  3 mL Intravenous Q12H Radene Gunning, NP        Past Medical History  Diagnosis Date  . Coronary artery disease 2000    Status post stent placement  . Hyperlipidemia   . Hypertension   . Cardiac tamponade   . Supraventricular tachycardia   . Dyspnea on exertion   . GERD (gastroesophageal reflux disease)   . Barrett's esophagus   . Pancoast tumor of right lung   . Abdominal aortic aneurysm     Repair Dr. Kellie Simmering 2006  . Descending thoracic aortic dissection 1995  . Hypothyroidism   . History of kidney stones   . Kidney dysfunction     ? one kidney not working per pt ? side  . Lung cancer     stage III NSC    Past Surgical History  Procedure Laterality Date  . Colonoscopy  01/10/2001    Normal  . Egd with biopsy  12/03/2005  . Abdominal aortic aneurysm repair  06/12/2005    . Abdominal aortogram    . Laminectomies      3  . Esophagogastroduodenoscopy  04/10/2009    Dr. Gala Romney short segment of salmon-colored epithelium consistent with prior short segment Barrett's (biopsy benign)  . Cholecystectomy  07/09/10    Chronic cholelithiasis Dr. Arnoldo Morale  . Portacath placement Left 09/25/2013    Procedure: INSERTION PORT-A-CATH;  Surgeon: Melrose Nakayama, MD;  Location: [facility];  Service: Thoracic;  Laterality: Left;    History   Social History  . Marital Status: Married    Spouse Name: N/A    Number of Children: 4  . Years of Education: N/A   Occupational History  . retired; Therapist, sports    Social History Main Topics  . Smoking status: Former Smoker -- 1.00 packs/day for 40 years    Types: Cigarettes    Quit date: 06/29/1993  . Smokeless tobacco: Not on file  . Alcohol Use: No     Comment: small glass of wine 3-4 times per yr  . Drug Use: No  . Sexual Activity: Not on file   Other Topics Concern  . Not on file   Social History Narrative  . No narrative on file     No family history of premature CAD in 1st degree relatives.  Prior to Admission medications   Medication Sig Start Date End Date Taking? Authorizing Provider  acetaminophen (TYLENOL) 500 MG tablet Take 1,000 mg by mouth daily as needed for moderate pain.   Yes Historical Provider, MD  aspirin EC 81 MG tablet Take 81 mg by mouth every morning.   Yes Historical Provider, MD  atorvastatin (LIPITOR) 10 MG tablet Take 10 mg by mouth every evening.   Yes Historical Provider, MD  docusate sodium (COLACE) 100 MG capsule Take 100 mg by mouth 3 (three) times daily as needed for mild constipation.   Yes Historical Provider, MD  dronabinol (MARINOL) 5 MG capsule Take 5 mg by mouth 3 (three) times daily.   Yes Historical Provider, MD  HYDROcodone-acetaminophen (NORCO/VICODIN) 5-325 MG per tablet Take 1 tablet by mouth every 6 (six) hours as needed for moderate pain. 08/18/13  Yes Merryl Hacker, MD   levothyroxine (SYNTHROID, LEVOTHROID) 50 MCG tablet Take 50 mcg by mouth daily before breakfast.   Yes Historical Provider, MD  Magnesium Hydroxide (MILK OF MAGNESIA PO) Take 15 mLs by mouth 4 (four) times daily as needed.   Yes Historical Provider, MD  metoprolol (LOPRESSOR) 100 MG tablet Take 1 tablet (100 mg total) by mouth 2 (two) times daily. 09/05/13  Yes Dorothy Spark, MD  omeprazole (PRILOSEC OTC) 20 MG tablet Take 20 mg by mouth daily.   Yes Historical Provider, MD  polyethylene glycol (MIRALAX / GLYCOLAX) packet Take 17 g by mouth daily as needed for mild constipation.   Yes Historical Provider, MD  quinapril (ACCUPRIL) 20 MG tablet Take 20 mg by mouth every morning.   Yes Historical Provider, MD     Review of systems complete and found to be negative unless listed above in HPI     Physical exam Blood pressure 129/75, pulse 70, temperature 98 F (36.7 C), temperature source Oral, resp. rate 18, SpO2 90.00%. General: NAD, elderly, frail, malnourished Neck: No JVD, no thyromegaly or thyroid nodule.  Lungs: Clear to auscultation bilaterally with normal respiratory effort. CV: Nondisplaced PMI.  Heart regular S1/S2, no S3/S4, no murmur.  No peripheral edema.  No carotid bruit.  Normal pedal pulses.  Abdomen: Soft, nontender, no hepatosplenomegaly, no distention.  Skin: Intact without lesions or rashes.  Neurologic: Alert and oriented x 3.  Psych: Normal affect. Extremities: No clubbing or cyanosis.  HEENT: Normal.   Labs:   Lab Results  Component Value Date   WBC 7.6 10/06/2013   HGB 10.8* 10/06/2013   HCT 32.0* 10/06/2013   MCV 95.0 10/06/2013   PLT 254 10/06/2013    Recent Labs Lab 10/05/13 1156 10/06/13 0505  NA 134* 138  K 4.5 4.6  CL 94* 103  CO2 24 24  BUN 21 18  CREATININE 0.62 0.60  CALCIUM 9.9 9.2  PROT 7.0  --   BILITOT 0.5  --   ALKPHOS 105  --   ALT 17  --   AST 26  --   GLUCOSE 146* 112*   Lab Results  Component Value Date   TROPONINI <0.30  10/06/2013    Lab Results  Component Value Date   CHOL 127 03/07/2012   CHOL 127 03/25/2009   Lab Results  Component Value Date   HDL 37.30* 03/07/2012   HDL 31.90* 03/25/2009   Lab Results  Component Value Date   LDLCALC 61 03/07/2012   LDLCALC 67 03/25/2009   Lab Results  Component Value Date   TRIG 143.0 03/07/2012   TRIG 139.0 03/25/2009   Lab Results  Component Value Date   CHOLHDL 3 03/07/2012   CHOLHDL 4 03/25/2009   No results found for this basename: LDLDIRECT         Studies: No results found.  ASSESSMENT AND PLAN:  1. SVT: Likely being triggered by NSCLC and associated treatments (chemo, radiation) as well as dehydration. Electrolytes and TSH are normal. I will review previously ordered echocardiogram, to make certain she does not have a pericardial effusion given her malignancy and increasing dyspnea. I will add low-dose diltiazem 30 mg bid in addition to her metoprolol, with the hopes of suppressing atrial ectopy which can potentiate SVT. I feel medical management would be the most prudent strategy. 2. CAD with prior RCA stenting: She denies chest pain, but does have increasing dyspnea. Will review echo to evaluate LV systolic function, regional wall motion, and to make certain there is no pericardial effusion given her malignancy. Continue ASA, statin, and beta blocker. 3. 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  4. HTN: Currently normotensive. ACEI  has been held and given the addition of diltiazem to suppress atrial ectopy and SVT, ACEI may need to be held indefinitely so as to avoid hypotension.  5. Hyperlipidemia: Continue current dose of Lipitor. Last lipid panel I find on record is dated 03/07/12. This can be assessed as an outpatient. 6. Anemia: Hgb 11.5-->10.8 after hydration. Likely multifactorial in etiology (malignancy, CKD, malnutrition),  and will need to be monitored. No overt signs of bleeding.  Signed: Kate Sable, M.D., F.A.C.C.  10/06/2013, 9:27 AM

## 2013-10-27 DEATH — deceased

## 2015-01-29 ENCOUNTER — Encounter: Payer: Self-pay | Admitting: Internal Medicine

## 2015-02-18 ENCOUNTER — Telehealth: Payer: Self-pay | Admitting: Internal Medicine

## 2015-02-18 NOTE — Telephone Encounter (Signed)
Noted  

## 2015-02-18 NOTE — Telephone Encounter (Signed)
Pt's husband called today saying that we had sent a letter for a follow up and wanted to let us know that [name] is deceased. I removed any recalls that she was flagged for.

## 2015-02-18 NOTE — Telephone Encounter (Signed)
noted 

## 2015-08-07 ENCOUNTER — Encounter: Payer: Self-pay | Admitting: Cardiology

## 2016-01-31 IMAGING — CR DG LUMBAR SPINE COMPLETE 4+V
5 series · 5 of 5 positions shown · non-contrast
Comparison: CT 03/26/2007

CLINICAL DATA: Fall.  Bilateral hip pain.

EXAM:
LUMBAR SPINE - COMPLETE 4+ VIEW

[view not recorded (1 of 5)]
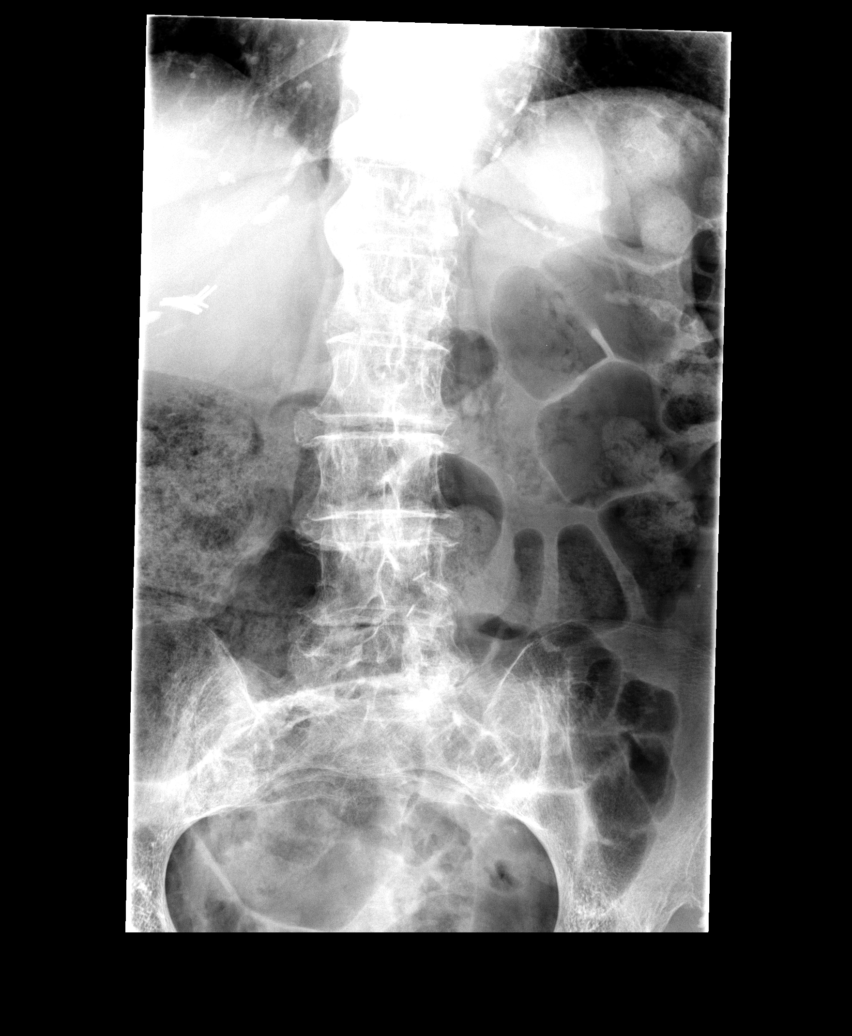

[view not recorded (2 of 5)]
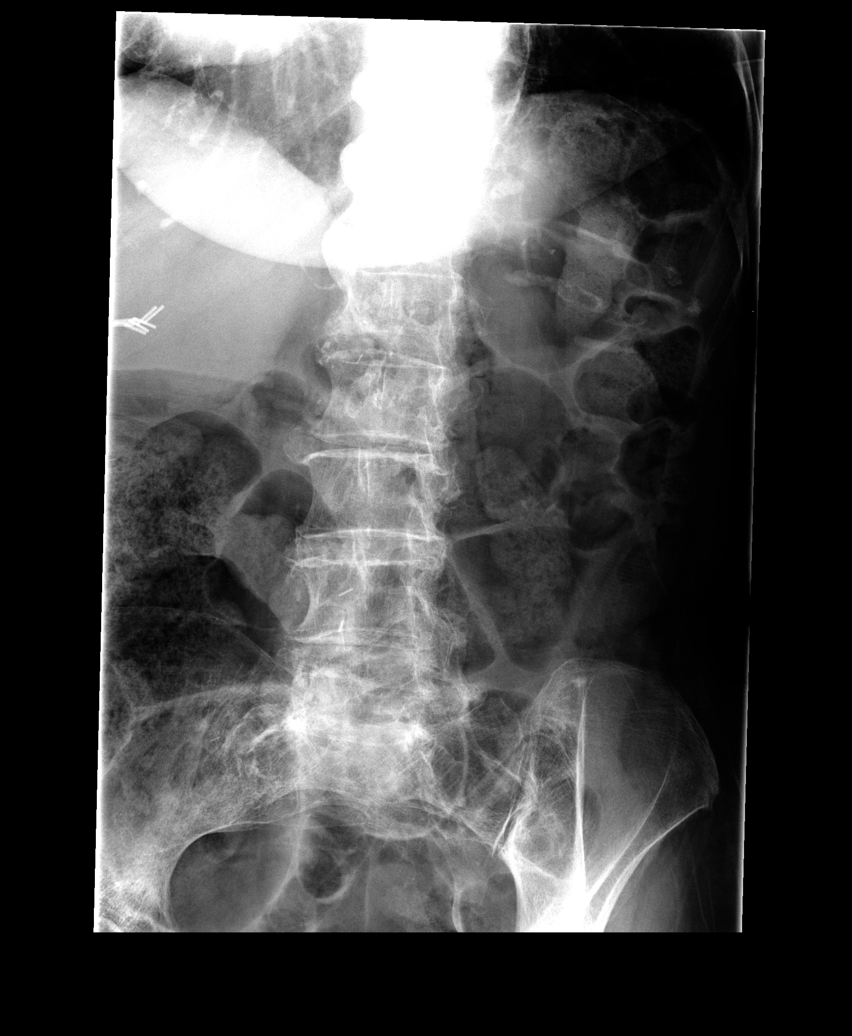

[view not recorded (3 of 5)]
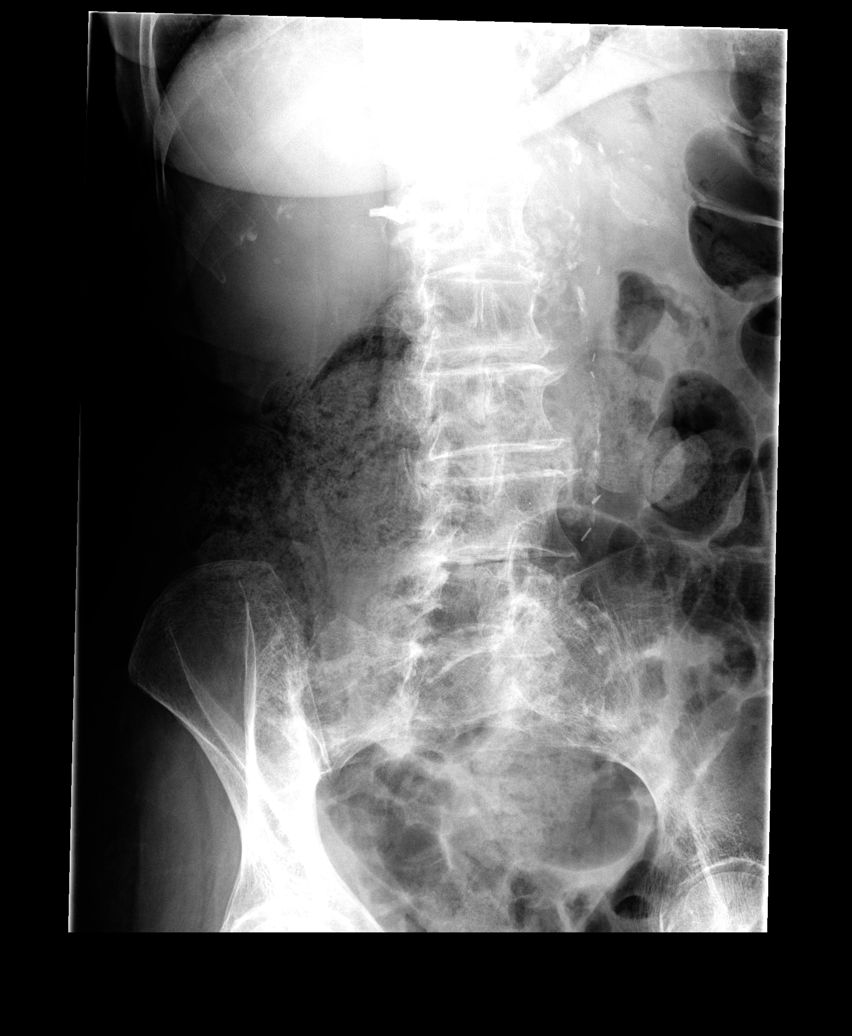

[view not recorded (4 of 5)]
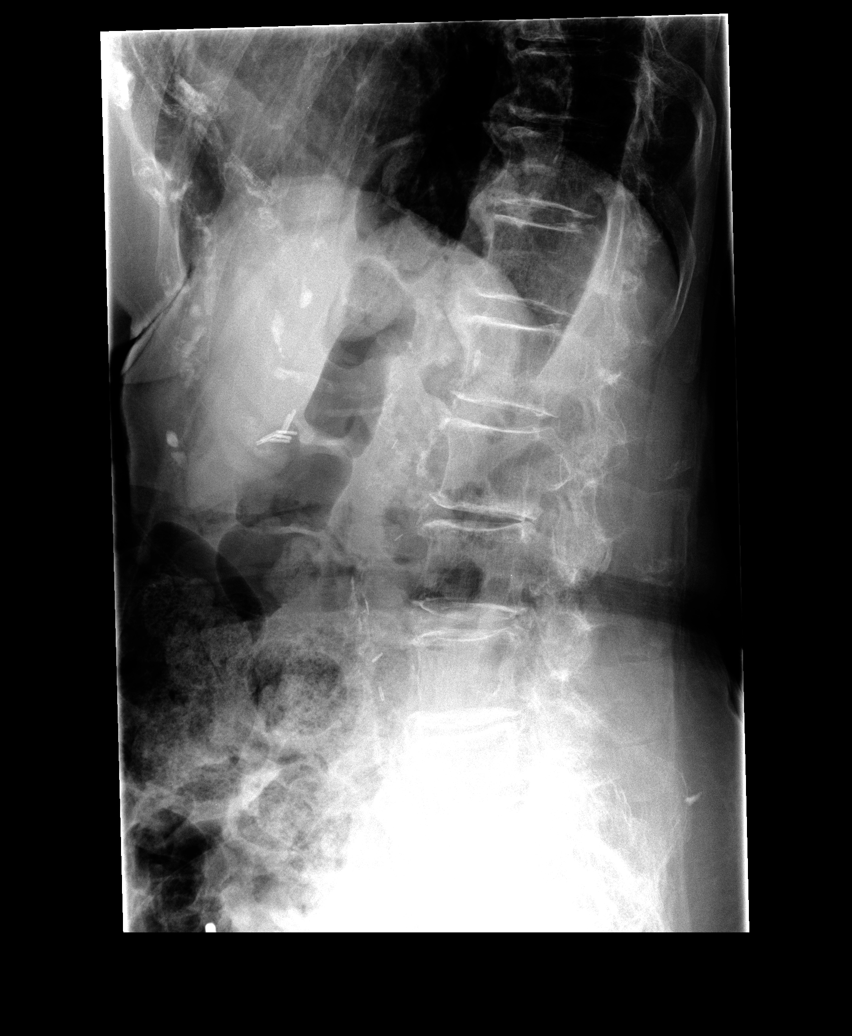

[view not recorded (5 of 5)]
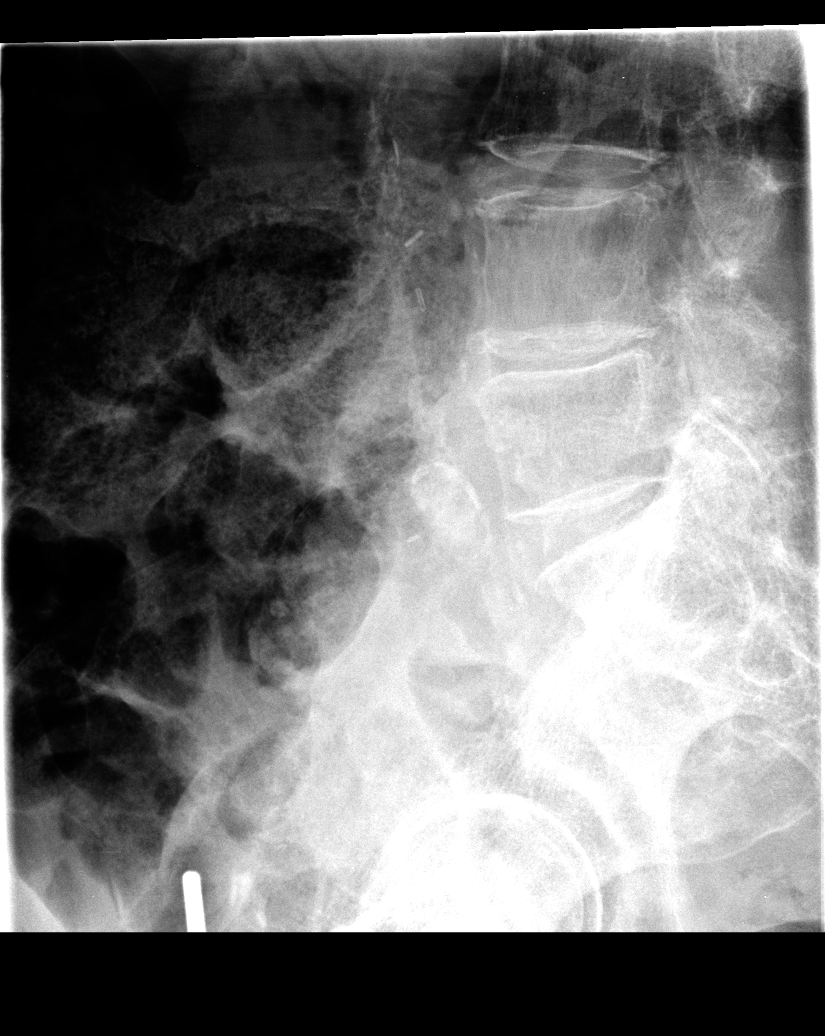

[5 of 5 positions shown; findings below may reference images not displayed]

FINDINGS: Alignment is normal. No fracture. There chronic superior endplate
osteophytes at L3. There is disc space narrowing and lower lumbar
facet degeneration. No other focal finding.
IMPRESSION: No acute or traumatic finding. Ordinary degenerative changes as
outlined above.

## 2017-02-10 ENCOUNTER — Encounter: Payer: Self-pay | Admitting: Internal Medicine
